# Patient Record
Sex: Male | Born: 1960 | Race: White | Hispanic: No | Marital: Married | Smoking: Former smoker
Health system: Southern US, Community
[De-identification: ages and names within clinical notes are randomized; demographics above are authoritative.]

## PROBLEM LIST (undated history)

## (undated) DIAGNOSIS — E785 Hyperlipidemia, unspecified: Secondary | ICD-10-CM

## (undated) HISTORY — PX: KNEE ARTHROSCOPY: SUR90

## (undated) HISTORY — DX: Hyperlipidemia, unspecified: E78.5

## (undated) HISTORY — PX: BRAIN SURGERY: SHX531

---

## 2004-02-09 ENCOUNTER — Ambulatory Visit (HOSPITAL_COMMUNITY): Admission: RE | Admit: 2004-02-09 | Discharge: 2004-02-09 | Payer: Self-pay | Admitting: Specialist

## 2006-12-11 ENCOUNTER — Ambulatory Visit: Payer: Self-pay | Admitting: Internal Medicine

## 2006-12-11 ENCOUNTER — Encounter: Admission: RE | Admit: 2006-12-11 | Discharge: 2006-12-11 | Payer: Self-pay | Admitting: Internal Medicine

## 2006-12-16 ENCOUNTER — Encounter: Payer: Self-pay | Admitting: Internal Medicine

## 2006-12-31 ENCOUNTER — Ambulatory Visit: Payer: Self-pay | Admitting: Internal Medicine

## 2006-12-31 LAB — CONVERTED CEMR LAB
Cholesterol, target level: 200 mg/dL
HDL goal, serum: 40 mg/dL

## 2007-03-23 ENCOUNTER — Ambulatory Visit: Payer: Self-pay | Admitting: Internal Medicine

## 2007-03-23 DIAGNOSIS — E785 Hyperlipidemia, unspecified: Secondary | ICD-10-CM | POA: Insufficient documentation

## 2007-07-24 ENCOUNTER — Telehealth (INDEPENDENT_AMBULATORY_CARE_PROVIDER_SITE_OTHER): Payer: Self-pay | Admitting: *Deleted

## 2007-10-05 ENCOUNTER — Ambulatory Visit: Payer: Self-pay | Admitting: Internal Medicine

## 2007-10-05 DIAGNOSIS — F988 Other specified behavioral and emotional disorders with onset usually occurring in childhood and adolescence: Secondary | ICD-10-CM | POA: Insufficient documentation

## 2008-06-09 IMAGING — CR DG CHEST 2V
2 series · 2 of 2 positions shown · non-contrast
Comparison: none

CLINICAL DATA: Short of breath with exertion.  Smoking history. 
 CHEST - 2 VIEW:

[w chest pa]
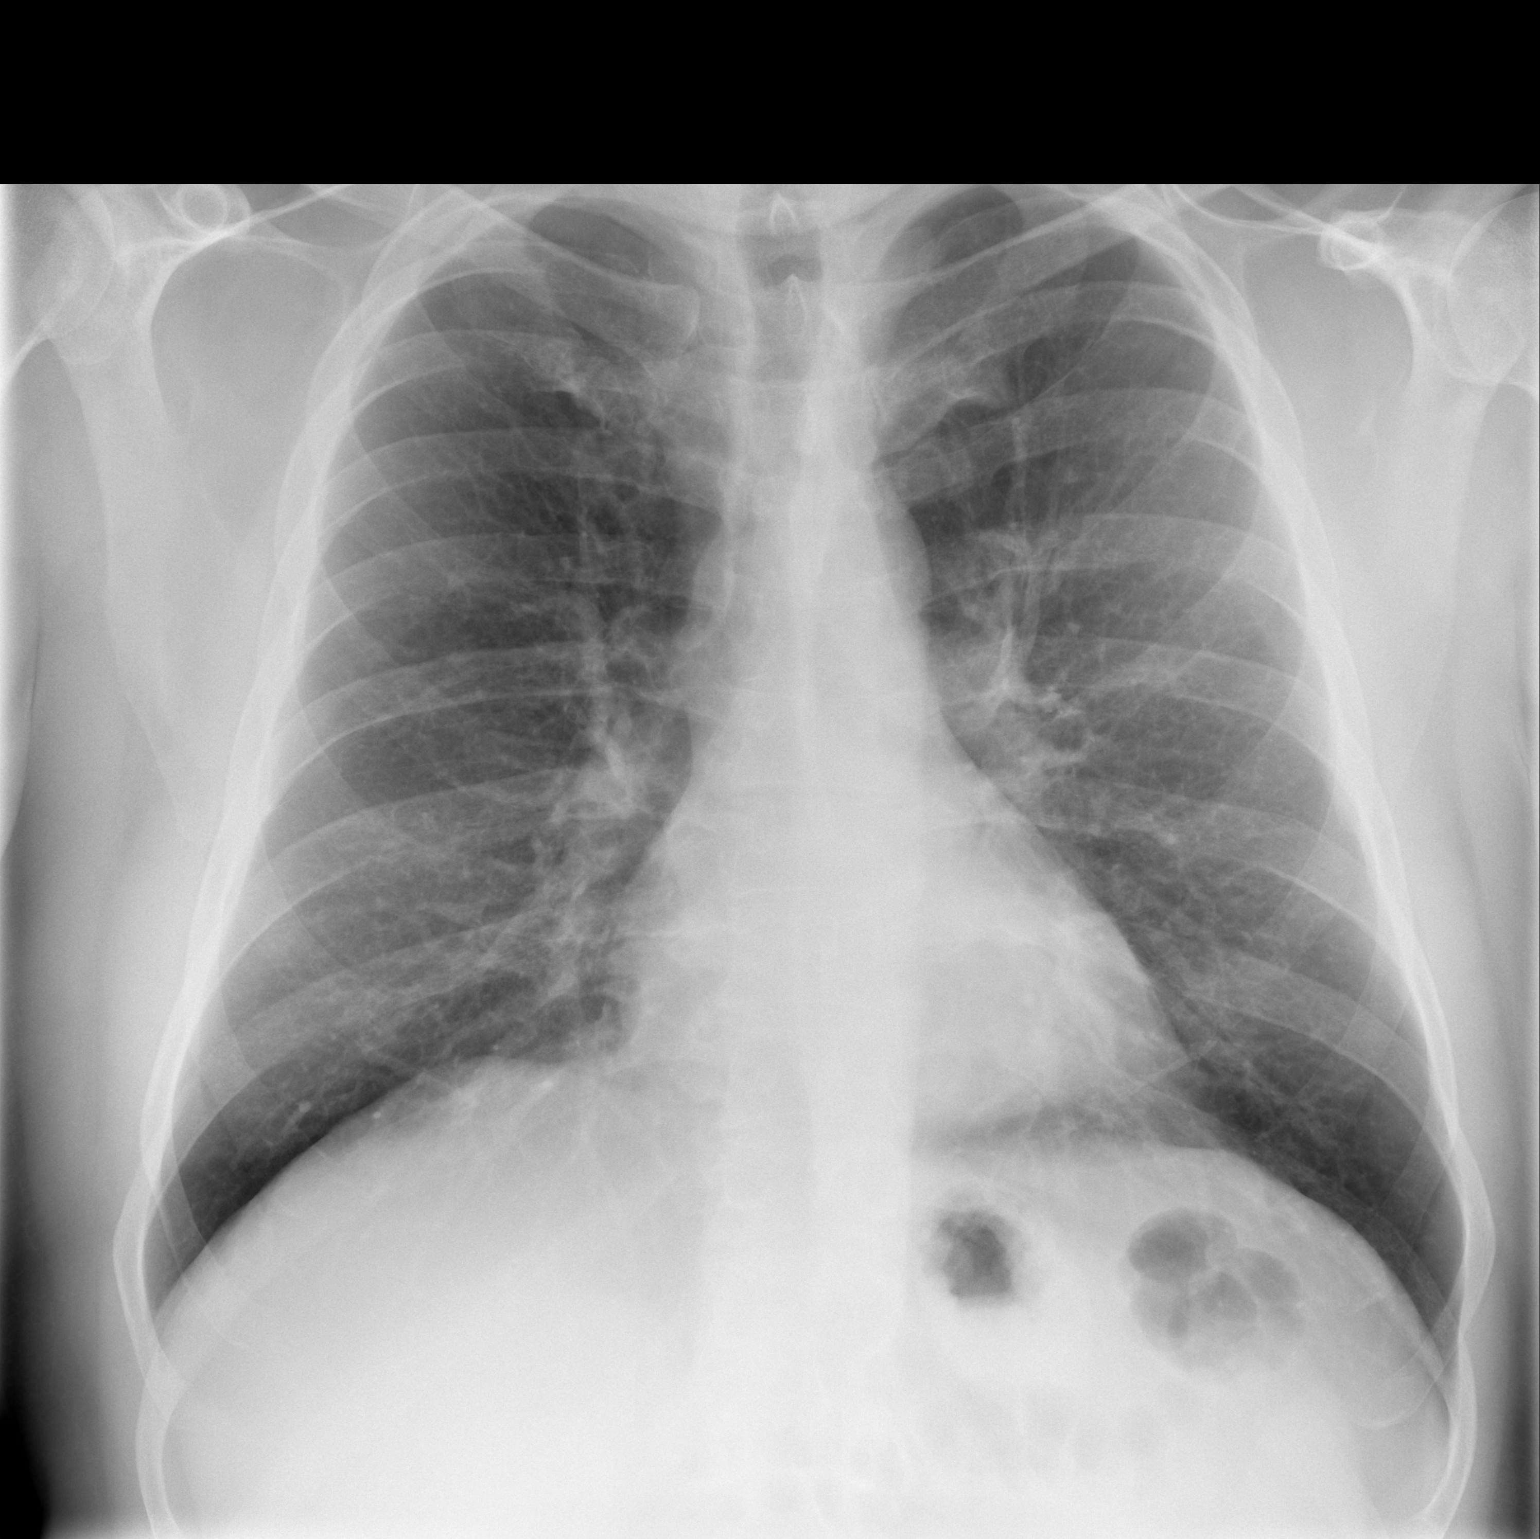

[w chest lat]
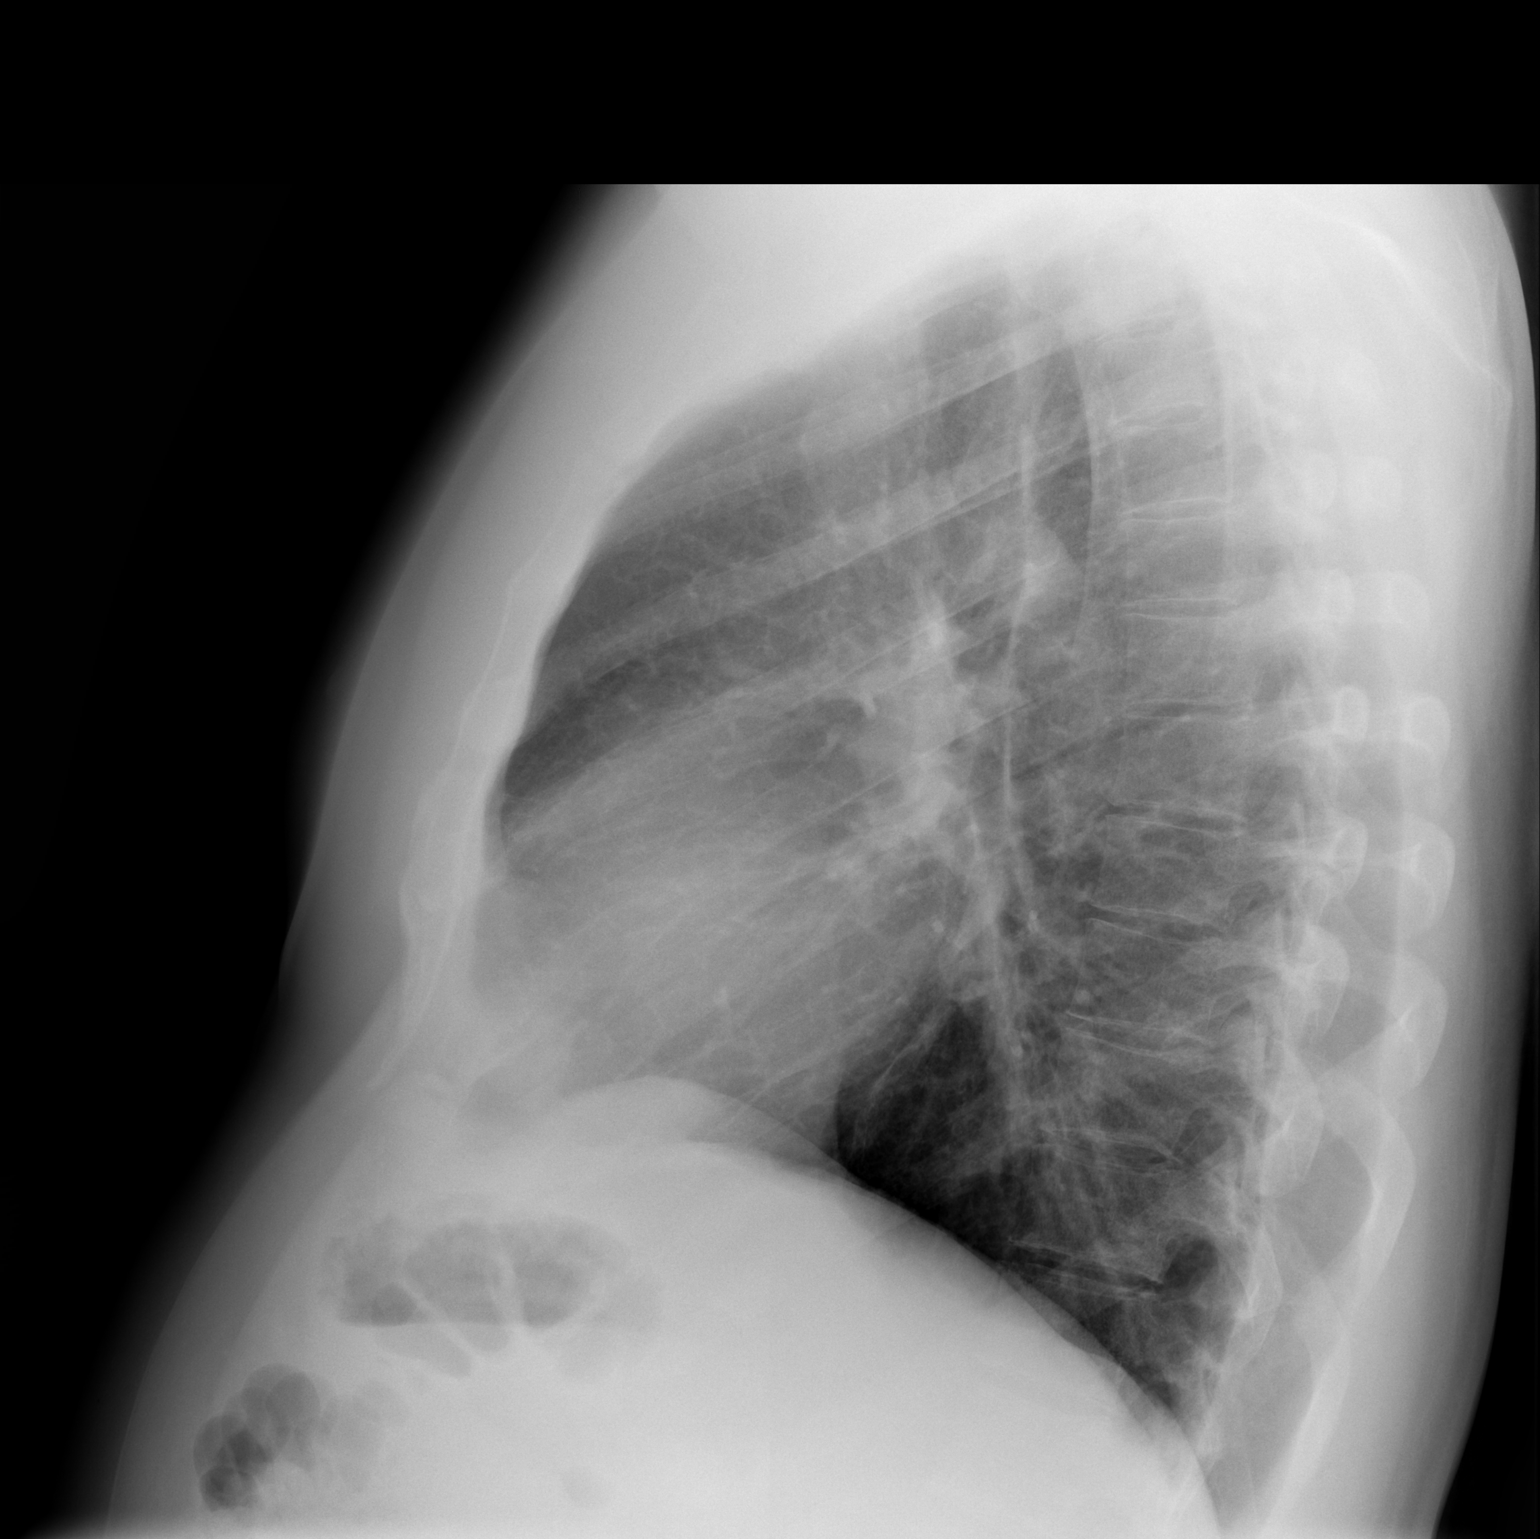

[2 of 2 positions shown; findings below may reference images not displayed]

FINDINGS: Two views of the chest show the lungs to be clear, and slightly hyperaerated.  The heart is within normal limits in size.  Old, healed right clavicular fracture is present.
IMPRESSION: No active lung disease.

## 2008-06-24 ENCOUNTER — Ambulatory Visit: Payer: Self-pay | Admitting: Internal Medicine

## 2008-06-24 DIAGNOSIS — R635 Abnormal weight gain: Secondary | ICD-10-CM | POA: Insufficient documentation

## 2008-06-25 ENCOUNTER — Encounter: Payer: Self-pay | Admitting: Internal Medicine

## 2008-07-18 ENCOUNTER — Ambulatory Visit: Payer: Self-pay | Admitting: Internal Medicine

## 2008-07-20 ENCOUNTER — Encounter (INDEPENDENT_AMBULATORY_CARE_PROVIDER_SITE_OTHER): Payer: Self-pay | Admitting: *Deleted

## 2008-11-01 ENCOUNTER — Ambulatory Visit: Payer: Self-pay | Admitting: Internal Medicine

## 2008-11-05 LAB — CONVERTED CEMR LAB
ALT: 44 units/L (ref 0–53)
AST: 25 units/L (ref 0–37)
Albumin: 3.9 g/dL (ref 3.5–5.2)
Bilirubin, Direct: 0.1 mg/dL (ref 0.0–0.3)
Cholesterol: 253 mg/dL — ABNORMAL HIGH (ref 0–200)
Direct LDL: 182.3 mg/dL
HDL: 36.2 mg/dL — ABNORMAL LOW (ref >39.00)
Total Bilirubin: 1.1 mg/dL (ref 0.3–1.2)
Total CHOL/HDL Ratio: 7
Triglycerides: 156 mg/dL — ABNORMAL HIGH (ref 0.0–149.0)
VLDL: 31.2 mg/dL (ref 0.0–40.0)

## 2008-11-08 ENCOUNTER — Encounter (INDEPENDENT_AMBULATORY_CARE_PROVIDER_SITE_OTHER): Payer: Self-pay | Admitting: *Deleted

## 2009-09-26 ENCOUNTER — Ambulatory Visit: Payer: Self-pay | Admitting: Internal Medicine

## 2009-09-26 DIAGNOSIS — R259 Unspecified abnormal involuntary movements: Secondary | ICD-10-CM | POA: Insufficient documentation

## 2009-10-02 ENCOUNTER — Ambulatory Visit: Payer: Self-pay | Admitting: Internal Medicine

## 2009-10-04 LAB — CONVERTED CEMR LAB
Alkaline Phosphatase: 54 units/L (ref 39–117)
BUN: 11 mg/dL (ref 6–23)
Basophils Relative: 0.1 % (ref 0.0–3.0)
Chloride: 101 meq/L (ref 96–112)
Cholesterol: 262 mg/dL — ABNORMAL HIGH (ref 0–200)
Eosinophils Absolute: 0.2 10*3/uL (ref 0.0–0.7)
HDL: 41.6 mg/dL (ref >39.00)
Hemoglobin: 14.5 g/dL (ref 13.0–17.0)
Lymphocytes Relative: 38.1 % (ref 12.0–46.0)
MCHC: 32.8 g/dL (ref 30.0–36.0)
MCV: 90.6 fL (ref 78.0–100.0)
Magnesium: 2.1 mg/dL (ref 1.5–2.5)
Monocytes Relative: 9.2 % (ref 3.0–12.0)
Neutrophils Relative %: 49.9 % (ref 43.0–77.0)
Platelets: 362 10*3/uL (ref 150.0–400.0)
RBC: 4.87 M/uL (ref 4.22–5.81)
TSH: 1.43 microintl units/mL (ref 0.35–5.50)
Total CHOL/HDL Ratio: 6
VLDL: 47 mg/dL — ABNORMAL HIGH (ref 0.0–40.0)
WBC: 5.9 10*3/uL (ref 4.5–10.5)

## 2010-08-05 LAB — CONVERTED CEMR LAB
ALT: 29 units/L (ref 0–40)
ALT: 52 U/L
AST: 26 U/L
Albumin: 4.1 g/dL
Albumin: 4.2 g/dL (ref 3.5–5.2)
Alkaline Phosphatase: 58 U/L
Alkaline Phosphatase: 68 units/L (ref 39–117)
BUN: 13 mg/dL
Basophils Absolute: 0.1 10*3/uL (ref 0.0–0.1)
Basophils Absolute: 0.1 K/uL
Basophils Relative: 0.9 %
Bilirubin, Direct: 0.1 mg/dL
CO2: 27 meq/L
Calcium: 9.4 mg/dL
Calcium: 9.8 mg/dL (ref 8.4–10.5)
Chloride: 102 meq/L
Chloride: 105 meq/L (ref 96–112)
Creatinine, Ser: 0.8 mg/dL
Creatinine, Ser: 0.9 mg/dL (ref 0.4–1.5)
Eosinophils Absolute: 0.2 K/uL
Eosinophils Relative: 3.5 %
Free T4: 1 ng/dL
GFR calc Af Amer: 134 mL/min
GFR calc non Af Amer: 111 mL/min
Glucose, Bld: 112 mg/dL — ABNORMAL HIGH
HCT: 43.5 %
HCT: 46.1 % (ref 39.0–52.0)
Hemoglobin: 15.3 g/dL
Hemoglobin: 16.1 g/dL (ref 13.0–17.0)
Hgb A1c MFr Bld: 5.8 %
Lymphocytes Relative: 34.9 %
Lymphocytes Relative: 37.3 % (ref 12.0–46.0)
MCHC: 35 g/dL (ref 30.0–36.0)
MCHC: 35.3 g/dL
MCV: 89.3 fL
Monocytes Absolute: 0.5 10*3/uL (ref 0.2–0.7)
Monocytes Absolute: 0.5 K/uL
Monocytes Relative: 6.9 % (ref 3.0–11.0)
Monocytes Relative: 8.1 %
Neutro Abs: 2.9 K/uL
Neutrophils Relative %: 52.6 %
PSA: 0.48 ng/mL
PSA: 1.48 ng/mL (ref 0.10–4.00)
Platelets: 306 K/uL
Platelets: 340 10*3/uL (ref 150–400)
Potassium: 4.1 meq/L
Potassium: 4.4 meq/L (ref 3.5–5.1)
RBC: 4.87 M/uL
RDW: 12.3 %
Sodium: 136 meq/L
TSH: 0.86 microintl units/mL (ref 0.35–5.50)
TSH: 1.09 u[IU]/mL
Total Bilirubin: 1.1 mg/dL (ref 0.3–1.2)
Total Bilirubin: 1.3 mg/dL — ABNORMAL HIGH
Total Protein: 6.7 g/dL (ref 6.0–8.3)
Total Protein: 7.1 g/dL
WBC: 5.7 10*3/microliter

## 2010-08-07 NOTE — Assessment & Plan Note (Signed)
Summary: FOR RIGHT EYE TWITCHES///PH   Vital Signs:  Patient profile:   50 year old male Weight:      278 pounds Temp:     98.2 degrees F oral Pulse rate:   80 / minute BP sitting:   118 / 72  (left arm)  Vitals Entered By: Jeremy Johann CMA (September 26, 2009 4:32 PM) CC: left eye twitching x1year Comments REVIEWED MED LIST, PATIENT AGREED DOSE AND INSTRUCTION CORRECT    CC:  left eye twitching x1year.  History of Present Illness: Twitching of OS & L face > 10 months; he has 1 pot of coffee / day & 22 oz Diet Pepsi / day. Non smoker X 2+ years. No FH of tremors. No PMH of Bell's Palsy. Note : off statin 6+ months.  Allergies (verified): No Known Drug Allergies  Review of Systems General:  Denies sleep disorder. Eyes:  Denies blurring, double vision, and vision loss-both eyes. ENT:  Denies decreased hearing and ringing in ears. Neuro:  Denies brief paralysis, disturbances in coordination, numbness, poor balance, tingling, tremors, and weakness.  Physical Exam  General:  well-nourished; alert,appropriate and cooperative throughout examination Eyes:  No corneal or conjunctival inflammation noted. EOMI. Perrla. Field of  Vision grossly normal. Slight nystagmus , unsustained Neurologic:  alert & oriented X3, cranial nerves II-XII intact, strength normal in all extremities, sensation intact to light touch, gait normal, DTRs symmetrical and normal, finger-to-nose normal, and Romberg negative.   Skin:  Intact without suspicious lesions or rashes   Impression & Recommendations:  Problem # 1:  ABNORMAL INVOLUNTARY MOVEMENTS (ICD-781.0) Facial tic incontext of excess caffeine  Complete Medication List: 1)  Simvastatin 40 Mg Tabs (Simvastatin) .Marland Kitchen.. 1 at bedtime  Patient Instructions: 1)  Wean caffeine over 8 weeks to 2 cups / day.Schedule  fasting labs if no better: 2)  BMP ; 3)  Hepatic Panel; 4)  Lipid Panel; 5)  TSH, free T4;Mg++; 6)  CBC w/ Diff .

## 2011-02-19 ENCOUNTER — Encounter: Payer: Self-pay | Admitting: Internal Medicine

## 2011-02-19 ENCOUNTER — Ambulatory Visit (INDEPENDENT_AMBULATORY_CARE_PROVIDER_SITE_OTHER): Payer: BC Managed Care – PPO | Admitting: Internal Medicine

## 2011-02-19 VITALS — BP 116/78 | HR 65 | Temp 98.6°F | Wt 290.2 lb

## 2011-02-19 DIAGNOSIS — D492 Neoplasm of unspecified behavior of bone, soft tissue, and skin: Secondary | ICD-10-CM

## 2011-02-19 NOTE — Patient Instructions (Signed)
Do not self treat these lesions as there is increased risk of infection because of the location.

## 2011-02-19 NOTE — Progress Notes (Signed)
  Subjective:    Patient ID: Nolon Nations, male    DOB: Feb 23, 1961, 50 y.o.   MRN: [id number]  HPI Skin lesions: Location: perineal area  Onset: 8 months or >   Course: increasing in number, not size Self-treated with: no          History Pruritis: no  Tenderness: no, only one which is tender with pressure  Red Flags Feeling ill: no Fever: no      Review of Systems     Objective:   Physical Exam  He is healthy appearing, in no distress.  Has no organomegaly or masses in the abdomen.  He has no inguinal lymphadenopathy. He has benign appearing, non-warty polyps on either thigh.  In the intergluteal  area of the buttocks he has multiple skin tags; all have a benign appearance.        Assessment & Plan:  #1 benign papules and skin tags; condyloma acuminatum is not suggested clinically.  Plan: Dermatologic referral for excision/ablation.

## 2011-02-20 ENCOUNTER — Encounter: Payer: Self-pay | Admitting: Internal Medicine

## 2011-03-08 ENCOUNTER — Telehealth: Payer: Self-pay | Admitting: Internal Medicine

## 2011-03-08 NOTE — Telephone Encounter (Signed)
Tried to call Pt to confirm what he need, phone hung up twice will try again later.

## 2011-03-13 NOTE — Telephone Encounter (Signed)
Pt is requesting to be referred to male dermatology. Pt notes that the current derm male physician is out on leave.. Please refer Pt to another derm that is within network.

## 2011-03-13 NOTE — Telephone Encounter (Signed)
Left message to call office

## 2011-03-20 NOTE — Telephone Encounter (Signed)
Referring patient to Dr. Terri Piedra, will inform patient.

## 2011-12-16 ENCOUNTER — Telehealth: Payer: Self-pay | Admitting: Internal Medicine

## 2011-12-16 ENCOUNTER — Other Ambulatory Visit: Payer: Self-pay | Admitting: Internal Medicine

## 2011-12-16 DIAGNOSIS — R253 Fasciculation: Secondary | ICD-10-CM

## 2011-12-16 NOTE — Telephone Encounter (Signed)
patientcalled requesting referral to [facility] ph# [phone] Patient stated he has spoke to Dr. Alwyn Ren about this for over the past 2-yrs.  Patient stated he is still having trouble with his right eye twitching Patient call back# [phone]

## 2011-12-18 NOTE — Telephone Encounter (Signed)
patient called again today to see if this was approved, please call at [phone]

## 2011-12-18 NOTE — Telephone Encounter (Signed)
Patient informed of Referral process [placed 06.10.13]/SLS

## 2014-06-15 ENCOUNTER — Ambulatory Visit (INDEPENDENT_AMBULATORY_CARE_PROVIDER_SITE_OTHER): Payer: BC Managed Care – PPO | Admitting: Internal Medicine

## 2014-06-15 ENCOUNTER — Encounter: Payer: Self-pay | Admitting: Internal Medicine

## 2014-06-15 VITALS — BP 140/88 | HR 67 | Temp 98.3°F | Wt 309.0 lb

## 2014-06-15 DIAGNOSIS — R030 Elevated blood-pressure reading, without diagnosis of hypertension: Secondary | ICD-10-CM

## 2014-06-15 NOTE — Progress Notes (Signed)
   Subjective:    Patient ID: Jose Bennett, male    DOB: [birth year], 53 y.o.   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 He will be hospitalized for 3 days and in the intensive care unit postoperatively. This is to be performed by a neurosurgeon at [facility]  6 months ago his blood pressure was elevated when he was being treated for bladder infection  He does ingest excess salt. He is not on a heart healthy diet  He has had dramatic weight gain.  Maternal grandmother had stroke in her 65s.    Review of Systems    Chest pain, palpitations, tachycardia, exertional dyspnea, paroxysmal nocturnal dyspnea, claudication or edema are absent.  He does describe easy fatigue.       Objective:   Physical Exam   Positive or pertinent findings include:   As per CDC Guidelines ,Epic documents obesity as being present . Abdomen is protuberant with a ventral hernia.  Appears healthy and well-nourished & in no acute distress No carotid bruits are present.No neck vein distention present at 10 - 15 degrees. Thyroid normal to palpation Heart rhythm and rate are normal with no gallop or murmur Chest is clear with no increased work of breathing There is no evidence of aortic aneurysm or renal artery bruits Abdomen soft with no organomegaly . No HJR No clubbing, cyanosis or edema present. Pedal pulses are intact  No ischemic skin changes are present . Fingernails/ toenails healthy  Alert and oriented. Strength, tone, DTRs reflexes normal          Assessment & Plan:  #1 elevated blood pressure without diagnosis of hypertension  #2 hemifacial spasm; preop labs are to be done this week.  #3 easy fatigability  Plan:BMET and TSH recommended as part of the preop panel  Blood pressure goals discussed

## 2014-06-15 NOTE — Progress Notes (Signed)
Pre visit review using our clinic review tool, if applicable. No additional management support is needed unless otherwise documented below in the visit note. 

## 2014-06-15 NOTE — Patient Instructions (Addendum)
Your ideal BP goal = AVERAGE < 135/85.Minimal goal is average < 140/90.  This AVERAGE should be calculated from @ least 5-7 BP readings taken @ different times of day on different days of week. You should not respond to isolated BP readings , but rather the AVERAGE for that week .Please bring your  blood pressure cuff to office visits to verify that it is reliable.It  can also be checked against the blood pressure device at the pharmacy. Finger or wrist cuffs are not dependable; an arm cuff is.  Avoid ingestion of  excess salt/sodium.Cook with pepper & other spices . Use the salt substitute "No Salt" OR the Mrs Deliah Boston products to season food @ the table. Avoid foods which taste salty or "vinegary" as their sodium content will be high.

## 2017-06-17 ENCOUNTER — Ambulatory Visit: Payer: Self-pay | Admitting: Emergency Medicine

## 2017-06-20 ENCOUNTER — Encounter: Payer: Self-pay | Admitting: Emergency Medicine

## 2017-06-20 ENCOUNTER — Other Ambulatory Visit: Payer: Self-pay

## 2017-06-20 ENCOUNTER — Ambulatory Visit (INDEPENDENT_AMBULATORY_CARE_PROVIDER_SITE_OTHER): Payer: BLUE CROSS/BLUE SHIELD | Admitting: Emergency Medicine

## 2017-06-20 VITALS — BP 138/88 | HR 64 | Temp 97.8°F | Resp 16 | Ht 74.0 in | Wt 317.4 lb

## 2017-06-20 DIAGNOSIS — Z0000 Encounter for general adult medical examination without abnormal findings: Secondary | ICD-10-CM | POA: Diagnosis not present

## 2017-06-20 NOTE — Progress Notes (Signed)
Jose Bennett 56 y.o.   Chief Complaint  Patient presents with  . Establish Care  . Annual Exam    HISTORY OF PRESENT ILLNESS: This is a 56 y.o. male Here for annual exam; no complaints and no medical concerns. Grades his health a B+. Had colonoscopy 2011; told it was normal.  HPI   Prior to Admission medications   Medication Sig Start Date End Date Taking? Authorizing Provider  aspirin EC 81 MG tablet Take 81 mg by mouth daily.   Yes [provider]  CALCIUM PO Take by mouth daily.   Yes [provider]  Cholecalciferol (VITAMIN D PO) Take by mouth once a week.   Yes [provider]  Magnesium 250 MG TABS Take by mouth daily.   Yes [provider]    No Known Allergies  Patient Active Problem List   Diagnosis Date Noted  . ABNORMAL INVOLUNTARY MOVEMENTS 09/26/2009  . WEIGHT GAIN 06/24/2008  . ADD 10/05/2007  . HYPERLIPIDEMIA NEC/NOS 03/23/2007    Past Medical History:  Diagnosis Date  . Hyperlipidemia       Social History   Socioeconomic History  . Marital status: Married    Spouse name: Not on file  . Number of children: Not on file  . Years of education: Not on file  . Highest education level: Not on file  Social Needs  . Financial resource strain: Not on file  . Food insecurity - worry: Not on file  . Food insecurity - inability: Not on file  . Transportation needs - medical: Not on file  . Transportation needs - non-medical: Not on file  Occupational History  . Not on file  Tobacco Use  . Smoking status: Former Smoker    Last attempt to quit: 07/23/2007    Years since quitting: 9.9  . Smokeless tobacco: Never Used  Substance and Sexual Activity  . Alcohol use: Yes  . Drug use: No  . Sexual activity: Not on file  Other Topics Concern  . Not on file  Social History Narrative  . Not on file    Family History  Problem Relation Age of Onset  . Prostate cancer Father   . Heart attack Father 37   smoker  . Cancer Father        LUNG  . Diabetes Mother   . Thyroid disease Sister   . Heart attack Maternal Grandfather 70  . Heart attack Paternal Grandfather 56     Review of Systems  Constitutional: Negative.  Negative for chills and fever.  HENT: Negative.  Negative for congestion, ear pain, hearing loss, nosebleeds and sore throat.   Eyes: Negative.  Negative for blurred vision, double vision, photophobia and pain.  Respiratory: Negative.  Negative for cough, hemoptysis and shortness of breath.   Cardiovascular: Negative.  Negative for chest pain, palpitations, claudication and leg swelling.  Gastrointestinal: Positive for blood in stool (twice in the past several months). Negative for abdominal pain, diarrhea, nausea and vomiting.  Genitourinary: Negative.  Negative for dysuria, flank pain, frequency and hematuria.  Musculoskeletal: Negative.  Negative for back pain, joint pain, myalgias and neck pain.  Skin: Negative.  Negative for rash.  Neurological: Negative.  Negative for dizziness, sensory change, focal weakness, weakness and headaches.  Endo/Heme/Allergies: Negative.   All other systems reviewed and are negative.  Vitals:   06/20/17 1036  BP: 138/88  Pulse: 64  Resp: 16  Temp: 97.8 F (36.6 C)  SpO2: 96%  Physical Exam  Constitutional: He is oriented to person, place, and time. He appears well-developed.  Obese.  HENT:  Head: Normocephalic and atraumatic.  Right Ear: External ear normal.  Left Ear: External ear normal.  Nose: Nose normal.  Mouth/Throat: Oropharynx is clear and moist.  Eyes: Conjunctivae and EOM are normal. Pupils are equal, round, and reactive to light.  Neck: Normal range of motion. Neck supple. No JVD present. No thyromegaly present.  Cardiovascular: Normal rate, regular rhythm, normal heart sounds and intact distal pulses.  Pulmonary/Chest: Effort normal and breath sounds normal. No respiratory distress.  Abdominal: Soft. Bowel  sounds are normal. He exhibits no distension and no mass. There is no tenderness. There is no guarding. No hernia.  Genitourinary: Prostate normal. Rectal exam shows external hemorrhoid.  Musculoskeletal: Normal range of motion.  Lymphadenopathy:    He has no cervical adenopathy.  Neurological: He is alert and oriented to person, place, and time. No sensory deficit. He exhibits normal muscle tone. Coordination normal.  Skin: Skin is warm and dry. Capillary refill takes less than 2 seconds. No rash noted.  Psychiatric: He has a normal mood and affect. His behavior is normal.  Vitals reviewed.    ASSESSMENT & PLAN: Dietrich was seen today for establish care and annual exam.  Diagnoses and all orders for this visit:  Routine general medical examination at a health care facility -     CBC with Differential -     Comprehensive metabolic panel -     Hemoglobin A1c -     Lipid panel -     PSA(Must document that pt has been informed of limitations of PSA testing.) -     TSH -     Hepatitis C antibody screen -     HIV antibody -     Visual acuity screening (in office) -     Urine Microscopic    Patient Instructions       IF you received an x-ray today, you will receive an invoice from [facility]. Please contact [facility] at [phone] with questions or concerns regarding your invoice.   IF you received labwork today, you will receive an invoice from [facility]. Please contact [facility] at [phone] with questions or concerns regarding your invoice.   Our billing staff will not be able to assist you with questions regarding bills from these companies.  You will be contacted with the lab results as soon as they are available. The fastest way to get your results is to activate your My Chart account. Instructions are located on the last page of this paperwork. If you have not heard from [place] regarding the results in 2 weeks, please contact this office.       Health Maintenance, Male A healthy lifestyle and preventive care is important for your health and wellness. Ask your health care provider about what schedule of regular examinations is right for you. What should I know about weight and diet? Eat a Healthy Diet  Eat plenty of vegetables, fruits, whole grains, low-fat dairy products, and lean protein.  Do not eat a lot of foods high in solid fats, added sugars, or salt.  Maintain a Healthy Weight Regular exercise can help you achieve or maintain a healthy weight. You should:  Do at least 150 minutes of exercise each week. The exercise should increase your heart rate and make you sweat (moderate-intensity exercise).  Do strength-training exercises at least twice a week.  Watch Your Levels of Cholesterol and  Blood Lipids  Have your blood tested for lipids and cholesterol every 5 years starting at 55 years of age. If you are at high risk for heart disease, you should start having your blood tested when you are 55 years old. You may need to have your cholesterol levels checked more often if: ? Your lipid or cholesterol levels are high. ? You are older than 55 years of age. ? You are at high risk for heart disease.  What should I know about cancer screening? Many types of cancers can be detected early and may often be prevented. Lung Cancer  You should be screened every year for lung cancer if: ? You are a current smoker who has smoked for at least 30 years. ? You are a former smoker who has quit within the past 15 years.  Talk to your health care provider about your screening options, when you should start screening, and how often you should be screened.  Colorectal Cancer  Routine colorectal cancer screening usually begins at 54 years of age and should be repeated every 5-10 years until you are 55 years old. You may need to be screened more often if early forms of precancerous polyps or small growths are found. Your health care  provider may recommend screening at an earlier age if you have risk factors for colon cancer.  Your health care provider may recommend using home test kits to check for hidden blood in the stool.  A small camera at the end of a tube can be used to examine your colon (sigmoidoscopy or colonoscopy). This checks for the earliest forms of colorectal cancer.  Prostate and Testicular Cancer  Depending on your age and overall health, your health care provider may do certain tests to screen for prostate and testicular cancer.  Talk to your health care provider about any symptoms or concerns you have about testicular or prostate cancer.  Skin Cancer  Check your skin from head to toe regularly.  Tell your health care provider about any new moles or changes in moles, especially if: ? There is a change in a mole's size, shape, or color. ? You have a mole that is larger than a pencil eraser.  Always use sunscreen. Apply sunscreen liberally and repeat throughout the day.  Protect yourself by wearing long sleeves, pants, a wide-brimmed hat, and sunglasses when outside.  What should I know about heart disease, diabetes, and high blood pressure?  If you are 44-20 years of age, have your blood pressure checked every 3-5 years. If you are 52 years of age or older, have your blood pressure checked every year. You should have your blood pressure measured twice-once when you are at a hospital or clinic, and once when you are not at a hospital or clinic. Record the average of the two measurements. To check your blood pressure when you are not at a hospital or clinic, you can use: ? An automated blood pressure machine at a pharmacy. ? A home blood pressure monitor.  Talk to your health care provider about your target blood pressure.  If you are between 7-89 years old, ask your health care provider if you should take aspirin to prevent heart disease.  Have regular diabetes screenings by checking your  fasting blood sugar level. ? If you are at a normal weight and have a low risk for diabetes, have this test once every three years after the age of 62. ? If you are overweight and have a high  risk for diabetes, consider being tested at a younger age or more often.  A one-time screening for abdominal aortic aneurysm (AAA) by ultrasound is recommended for men aged 45-75 years who are current or former smokers. What should I know about preventing infection? Hepatitis B If you have a higher risk for hepatitis B, you should be screened for this virus. Talk with your health care provider to find out if you are at risk for hepatitis B infection. Hepatitis C Blood testing is recommended for:  Everyone born from [birth year] through 1964.  Anyone with known risk factors for hepatitis C.  Sexually Transmitted Diseases (STDs)  You should be screened each year for STDs including gonorrhea and chlamydia if: ? You are sexually active and are younger than 55 years of age. ? You are older than 55 years of age and your health care provider tells you that you are at risk for this type of infection. ? Your sexual activity has changed since you were last screened and you are at an increased risk for chlamydia or gonorrhea. Ask your health care provider if you are at risk.  Talk with your health care provider about whether you are at high risk of being infected with HIV. Your health care provider may recommend a prescription medicine to help prevent HIV infection.  What else can I do?  Schedule regular health, dental, and eye exams.  Stay current with your vaccines (immunizations).  Do not use any tobacco products, such as cigarettes, chewing tobacco, and e-cigarettes. If you need help quitting, ask your health care provider.  Limit alcohol intake to no more than 2 drinks per day. One drink equals 12 ounces of beer, 5 ounces of wine, or 1 ounces of hard liquor.  Do not use street drugs.  Do not share  needles.  Ask your health care provider for help if you need support or information about quitting drugs.  Tell your health care provider if you often feel depressed.  Tell your health care provider if you have ever been abused or do not feel safe at home. This information is not intended to replace advice given to you by your health care provider. Make sure you discuss any questions you have with your health care provider. Document Released: 12/21/2007 Document Revised: 02/21/2016 Document Reviewed: 03/28/2015 Elsevier Interactive Patient Education  2018 [place] (AHA) Exercise Recommendation  Being physically active is important to prevent heart disease and stroke, the nation's No. 1and No. 5killers. To improve overall cardiovascular health, we suggest at least 150 minutes per week of moderate exercise or 75 minutes per week of vigorous exercise (or a combination of moderate and vigorous activity). Thirty minutes a day, five times a week is an easy goal to remember. You will also experience benefits even if you divide your time into two or three segments of 10 to 15 minutes per day.  For people who would benefit from lowering their blood pressure or cholesterol, we recommend 40 minutes of aerobic exercise of moderate to vigorous intensity three to four times a week to lower the risk for heart attack and stroke.  Physical activity is anything that makes you move your body and burn calories.  This includes things like climbing stairs or playing sports. Aerobic exercises benefit your heart, and include walking, jogging, swimming or biking. Strength and stretching exercises are best for overall stamina and flexibility.  The simplest, positive change you can make to effectively improve your heart health  is to start walking. It's enjoyable, free, easy, social and great exercise. A walking program is flexible and boasts high success rates because people can stick  with it. It's easy for walking to become a regular and satisfying part of life.   For Overall Cardiovascular Health:  At least 30 minutes of moderate-intensity aerobic activity at least 5 days per week for a total of 150  OR   At least 25 minutes of vigorous aerobic activity at least 3 days per week for a total of 75 minutes; or a combination of moderate- and vigorous-intensity aerobic activity  AND   Moderate- to high-intensity muscle-strengthening activity at least 2 days per week for additional health benefits.  For Lowering Blood Pressure and Cholesterol  An average 40 minutes of moderate- to vigorous-intensity aerobic activity 3 or 4 times per week  What if I can't make it to the time goal? Something is always better than nothing! And everyone has to start somewhere. Even if you've been sedentary for years, today is the day you can begin to make healthy changes in your life. If you don't think you'll make it for 30 or 40 minutes, set a reachable goal for today. You can work up toward your overall goal by increasing your time as you get stronger. Don't let all-or-nothing thinking rob you of doing what you can every day.  Source:[email or web address], MD [facility]

## 2017-06-20 NOTE — Patient Instructions (Addendum)
   IF you received an x-ray today, you will receive an invoice from [facility]. Please contact  Radiology at [phone] with questions or concerns regarding your invoice.   IF you received labwork today, you will receive an invoice from [facility]. Please contact [facility] at [phone] with questions or concerns regarding your invoice.   Our billing staff will not be able to assist you with questions regarding bills from these companies.  You will be contacted with the lab results as soon as they are available. The fastest way to get your results is to activate your My Chart account. Instructions are located on the last page of this paperwork. If you have not heard from us regarding the results in 2 weeks, please contact this office.      Health Maintenance, Male A healthy lifestyle and preventive care is important for your health and wellness. Ask your health care provider about what schedule of regular examinations is right for you. What should I know about weight and diet? Eat a Healthy Diet  Eat plenty of vegetables, fruits, whole grains, low-fat dairy products, and lean protein.  Do not eat a lot of foods high in solid fats, added sugars, or salt.  Maintain a Healthy Weight Regular exercise can help you achieve or maintain a healthy weight. You should:  Do at least 150 minutes of exercise each week. The exercise should increase your heart rate and make you sweat (moderate-intensity exercise).  Do strength-training exercises at least twice a week.  Watch Your Levels of Cholesterol and Blood Lipids  Have your blood tested for lipids and cholesterol every 5 years starting at 55 years of age. If you are at high risk for heart disease, you should start having your blood tested when you are 55 years old. You may need to have your cholesterol levels checked more often if: ? Your lipid or cholesterol levels are high. ? You are older than 55 years of age. ? You  are at high risk for heart disease.  What should I know about cancer screening? Many types of cancers can be detected early and may often be prevented. Lung Cancer  You should be screened every year for lung cancer if: ? You are a current smoker who has smoked for at least 30 years. ? You are a former smoker who has quit within the past 15 years.  Talk to your health care provider about your screening options, when you should start screening, and how often you should be screened.  Colorectal Cancer  Routine colorectal cancer screening usually begins at 55 years of age and should be repeated every 5-10 years until you are 55 years old. You may need to be screened more often if early forms of precancerous polyps or small growths are found. Your health care provider may recommend screening at an earlier age if you have risk factors for colon cancer.  Your health care provider may recommend using home test kits to check for hidden blood in the stool.  A small camera at the end of a tube can be used to examine your colon (sigmoidoscopy or colonoscopy). This checks for the earliest forms of colorectal cancer.  Prostate and Testicular Cancer  Depending on your age and overall health, your health care provider may do certain tests to screen for prostate and testicular cancer.  Talk to your health care provider about any symptoms or concerns you have about testicular or prostate cancer.  Skin Cancer  Check your skin   from head to toe regularly.  Tell your health care provider about any new moles or changes in moles, especially if: ? There is a change in a mole's size, shape, or color. ? You have a mole that is larger than a pencil eraser.  Always use sunscreen. Apply sunscreen liberally and repeat throughout the day.  Protect yourself by wearing long sleeves, pants, a wide-brimmed hat, and sunglasses when outside.  What should I know about heart disease, diabetes, and high blood  pressure?  If you are 18-39 years of age, have your blood pressure checked every 3-5 years. If you are 40 years of age or older, have your blood pressure checked every year. You should have your blood pressure measured twice-once when you are at a hospital or clinic, and once when you are not at a hospital or clinic. Record the average of the two measurements. To check your blood pressure when you are not at a hospital or clinic, you can use: ? An automated blood pressure machine at a pharmacy. ? A home blood pressure monitor.  Talk to your health care provider about your target blood pressure.  If you are between 45-79 years old, ask your health care provider if you should take aspirin to prevent heart disease.  Have regular diabetes screenings by checking your fasting blood sugar level. ? If you are at a normal weight and have a low risk for diabetes, have this test once every three years after the age of 45. ? If you are overweight and have a high risk for diabetes, consider being tested at a younger age or more often.  A one-time screening for abdominal aortic aneurysm (AAA) by ultrasound is recommended for men aged 65-75 years who are current or former smokers. What should I know about preventing infection? Hepatitis B If you have a higher risk for hepatitis B, you should be screened for this virus. Talk with your health care provider to find out if you are at risk for hepatitis B infection. Hepatitis C Blood testing is recommended for:  Everyone born from 1945 through 1965.  Anyone with known risk factors for hepatitis C.  Sexually Transmitted Diseases (STDs)  You should be screened each year for STDs including gonorrhea and chlamydia if: ? You are sexually active and are younger than 55 years of age. ? You are older than 55 years of age and your health care provider tells you that you are at risk for this type of infection. ? Your sexual activity has changed since you were last  screened and you are at an increased risk for chlamydia or gonorrhea. Ask your health care provider if you are at risk.  Talk with your health care provider about whether you are at high risk of being infected with HIV. Your health care provider may recommend a prescription medicine to help prevent HIV infection.  What else can I do?  Schedule regular health, dental, and eye exams.  Stay current with your vaccines (immunizations).  Do not use any tobacco products, such as cigarettes, chewing tobacco, and e-cigarettes. If you need help quitting, ask your health care provider.  Limit alcohol intake to no more than 2 drinks per day. One drink equals 12 ounces of beer, 5 ounces of wine, or 1 ounces of hard liquor.  Do not use street drugs.  Do not share needles.  Ask your health care provider for help if you need support or information about quitting drugs.  Tell your health care   provider if you often feel depressed.  Tell your health care provider if you have ever been abused or do not feel safe at home. This information is not intended to replace advice given to you by your health care provider. Make sure you discuss any questions you have with your health care provider. Document Released: 12/21/2007 Document Revised: 02/21/2016 Document Reviewed: 03/28/2015 Elsevier Interactive Patient Education  2018 [place] (AHA) Exercise Recommendation  Being physically active is important to prevent heart disease and stroke, the nation's No. 1and No. 5killers. To improve overall cardiovascular health, we suggest at least 150 minutes per week of moderate exercise or 75 minutes per week of vigorous exercise (or a combination of moderate and vigorous activity). Thirty minutes a day, five times a week is an easy goal to remember. You will also experience benefits even if you divide your time into two or three segments of 10 to 15 minutes per day.  For people who would  benefit from lowering their blood pressure or cholesterol, we recommend 40 minutes of aerobic exercise of moderate to vigorous intensity three to four times a week to lower the risk for heart attack and stroke.  Physical activity is anything that makes you move your body and burn calories.  This includes things like climbing stairs or playing sports. Aerobic exercises benefit your heart, and include walking, jogging, swimming or biking. Strength and stretching exercises are best for overall stamina and flexibility.  The simplest, positive change you can make to effectively improve your heart health is to start walking. It's enjoyable, free, easy, social and great exercise. A walking program is flexible and boasts high success rates because people can stick with it. It's easy for walking to become a regular and satisfying part of life.   For Overall Cardiovascular Health:  At least 30 minutes of moderate-intensity aerobic activity at least 5 days per week for a total of 150  OR   At least 25 minutes of vigorous aerobic activity at least 3 days per week for a total of 75 minutes; or a combination of moderate- and vigorous-intensity aerobic activity  AND   Moderate- to high-intensity muscle-strengthening activity at least 2 days per week for additional health benefits.  For Lowering Blood Pressure and Cholesterol  An average 40 minutes of moderate- to vigorous-intensity aerobic activity 3 or 4 times per week  What if I can't make it to the time goal? Something is always better than nothing! And everyone has to start somewhere. Even if you've been sedentary for years, today is the day you can begin to make healthy changes in your life. If you don't think you'll make it for 30 or 40 minutes, set a reachable goal for today. You can work up toward your overall goal by increasing your time as you get stronger. Don't let all-or-nothing thinking rob you of doing what you can every day.   Source:http://www.heart.org

## 2017-06-21 LAB — COMPREHENSIVE METABOLIC PANEL
A/G RATIO: 1.8 (ref 1.2–2.2)
ALK PHOS: 65 IU/L (ref 39–117)
ALT: 51 IU/L — AB (ref 0–44)
AST: 25 IU/L (ref 0–40)
Albumin: 4.7 g/dL (ref 3.5–5.5)
BUN/Creatinine Ratio: 15 (ref 9–20)
BUN: 13 mg/dL (ref 6–24)
Bilirubin Total: 0.6 mg/dL (ref 0.0–1.2)
CALCIUM: 9.7 mg/dL (ref 8.7–10.2)
CO2: 24 mmol/L (ref 20–29)
Chloride: 100 mmol/L (ref 96–106)
Creatinine, Ser: 0.84 mg/dL (ref 0.76–1.27)
GFR calc Af Amer: 114 mL/min/{1.73_m2} (ref >59)
GFR, EST NON AFRICAN AMERICAN: 99 mL/min/{1.73_m2} (ref >59)
Globulin, Total: 2.6 g/dL (ref 1.5–4.5)
Glucose: 87 mg/dL (ref 65–99)
POTASSIUM: 4.6 mmol/L (ref 3.5–5.2)
SODIUM: 139 mmol/L (ref 134–144)
Total Protein: 7.3 g/dL (ref 6.0–8.5)

## 2017-06-21 LAB — CBC WITH DIFFERENTIAL/PLATELET
Basophils Absolute: 0.1 10*3/uL (ref 0.0–0.2)
Basos: 1 %
EOS (ABSOLUTE): 0.2 10*3/uL (ref 0.0–0.4)
Eos: 3 %
Hematocrit: 46.4 % (ref 37.5–51.0)
Hemoglobin: 16 g/dL (ref 13.0–17.7)
IMMATURE GRANULOCYTES: 0 %
Immature Grans (Abs): 0 10*3/uL (ref 0.0–0.1)
LYMPHS ABS: 2.6 10*3/uL (ref 0.7–3.1)
Lymphs: 41 %
MCH: 30.2 pg (ref 26.6–33.0)
MCHC: 34.5 g/dL (ref 31.5–35.7)
MCV: 88 fL (ref 79–97)
MONOS ABS: 0.4 10*3/uL (ref 0.1–0.9)
Monocytes: 6 %
NEUTROS PCT: 49 %
Neutrophils Absolute: 3.1 10*3/uL (ref 1.4–7.0)
PLATELETS: 358 10*3/uL (ref 150–379)
RBC: 5.29 x10E6/uL (ref 4.14–5.80)
RDW: 13.3 % (ref 12.3–15.4)
WBC: 6.5 10*3/uL (ref 3.4–10.8)

## 2017-06-21 LAB — HEMOGLOBIN A1C
Est. average glucose Bld gHb Est-mCnc: 131 mg/dL
Hgb A1c MFr Bld: 6.2 % — ABNORMAL HIGH (ref 4.8–5.6)

## 2017-06-21 LAB — URINALYSIS, MICROSCOPIC ONLY
Bacteria, UA: NONE SEEN
Casts: NONE SEEN /lpf
EPITHELIAL CELLS (NON RENAL): NONE SEEN /HPF (ref 0–10)
RBC, UA: NONE SEEN /hpf (ref 0–?)
WBC, UA: NONE SEEN /hpf (ref 0–?)

## 2017-06-21 LAB — LIPID PANEL
Chol/HDL Ratio: 7.3 ratio — ABNORMAL HIGH (ref 0.0–5.0)
Cholesterol, Total: 290 mg/dL — ABNORMAL HIGH (ref 100–199)
HDL: 40 mg/dL (ref >39)
LDL CALC: 220 mg/dL — AB (ref 0–99)
TRIGLYCERIDES: 151 mg/dL — AB (ref 0–149)
VLDL Cholesterol Cal: 30 mg/dL (ref 5–40)

## 2017-06-21 LAB — TSH: TSH: 1.12 u[IU]/mL (ref 0.450–4.500)

## 2017-06-21 LAB — HEPATITIS C ANTIBODY: Hep C Virus Ab: <0.1 s/co ratio (ref 0.0–0.9)

## 2017-06-21 LAB — HIV ANTIBODY (ROUTINE TESTING W REFLEX): HIV Screen 4th Generation wRfx: NONREACTIVE

## 2017-06-21 LAB — PSA: Prostate Specific Ag, Serum: 0.6 ng/mL (ref 0.0–4.0)

## 2017-06-23 ENCOUNTER — Encounter: Payer: Self-pay | Admitting: Emergency Medicine

## 2017-06-23 ENCOUNTER — Other Ambulatory Visit: Payer: Self-pay | Admitting: Emergency Medicine

## 2017-06-23 DIAGNOSIS — E7800 Pure hypercholesterolemia, unspecified: Secondary | ICD-10-CM

## 2017-06-23 MED ORDER — PRAVASTATIN SODIUM 40 MG PO TABS: 40.0000 mg | ORAL_TABLET | Freq: Every day | ORAL | 3 refills | Status: DC

## 2017-06-23 NOTE — Telephone Encounter (Signed)
Reviewed today and  MyChart message sent.

## 2018-03-22 ENCOUNTER — Encounter: Payer: Self-pay | Admitting: Emergency Medicine

## 2018-03-30 ENCOUNTER — Ambulatory Visit: Payer: BLUE CROSS/BLUE SHIELD | Admitting: Emergency Medicine

## 2018-03-31 ENCOUNTER — Ambulatory Visit: Payer: BLUE CROSS/BLUE SHIELD | Admitting: Emergency Medicine

## 2018-04-17 ENCOUNTER — Encounter: Payer: Self-pay | Admitting: Emergency Medicine

## 2018-08-20 ENCOUNTER — Other Ambulatory Visit: Payer: Self-pay

## 2018-08-20 ENCOUNTER — Ambulatory Visit (INDEPENDENT_AMBULATORY_CARE_PROVIDER_SITE_OTHER): Payer: BLUE CROSS/BLUE SHIELD | Admitting: Emergency Medicine

## 2018-08-20 ENCOUNTER — Encounter: Payer: Self-pay | Admitting: Emergency Medicine

## 2018-08-20 VITALS — BP 138/78 | HR 64 | Temp 98.6°F | Resp 16 | Ht 75.0 in | Wt 298.0 lb

## 2018-08-20 DIAGNOSIS — Z13220 Encounter for screening for lipoid disorders: Secondary | ICD-10-CM | POA: Diagnosis not present

## 2018-08-20 DIAGNOSIS — E785 Hyperlipidemia, unspecified: Secondary | ICD-10-CM

## 2018-08-20 DIAGNOSIS — Z130 Encounter for screening for diseases of the blood and blood-forming organs and certain disorders involving the immune mechanism: Secondary | ICD-10-CM

## 2018-08-20 DIAGNOSIS — Z23 Encounter for immunization: Secondary | ICD-10-CM

## 2018-08-20 DIAGNOSIS — Z0000 Encounter for general adult medical examination without abnormal findings: Secondary | ICD-10-CM

## 2018-08-20 DIAGNOSIS — Z0001 Encounter for general adult medical examination with abnormal findings: Secondary | ICD-10-CM

## 2018-08-20 DIAGNOSIS — Z1329 Encounter for screening for other suspected endocrine disorder: Secondary | ICD-10-CM | POA: Diagnosis not present

## 2018-08-20 DIAGNOSIS — Z125 Encounter for screening for malignant neoplasm of prostate: Secondary | ICD-10-CM

## 2018-08-20 DIAGNOSIS — Z13228 Encounter for screening for other metabolic disorders: Secondary | ICD-10-CM

## 2018-08-20 MED ORDER — ROSUVASTATIN CALCIUM 10 MG PO TABS: 10.0000 mg | ORAL_TABLET | Freq: Every day | ORAL | 3 refills | Status: DC

## 2018-08-20 NOTE — Patient Instructions (Addendum)

## 2018-08-20 NOTE — Progress Notes (Signed)
Jose Bennett 58 y.o.   Chief Complaint  Patient presents with  . Annual Exam    HISTORY OF PRESENT ILLNESS: This is a 58 y.o. male here for annual exam.  No complaints or medical concerns today. Has a history of dyslipidemia. Lab Results  Component Value Date   CHOL 290 (H) 06/20/2017   HDL 40 06/20/2017   LDLCALC 220 (H) 06/20/2017   LDLDIRECT 188.0 10/02/2009   TRIG 151 (H) 06/20/2017   CHOLHDL 7.3 (H) 06/20/2017   Wt Readings from Last 3 Encounters:  08/20/18 298 lb (135.2 kg)  06/20/17 (!) 317 lb 6.4 oz (144 kg)  06/15/14 (!) 309 lb (140.2 kg)     HPI   Prior to Admission medications   Medication Sig Start Date End Date Taking? Authorizing Provider  aspirin EC 81 MG tablet Take 81 mg by mouth daily.   Yes [provider]  CALCIUM PO Take by mouth daily.   Yes [provider]  Cholecalciferol (VITAMIN D PO) Take by mouth once a week.   Yes [provider]  Magnesium 250 MG TABS Take by mouth daily.   Yes [provider]  pravastatin (PRAVACHOL) 40 MG tablet Take 1 tablet (40 mg total) by mouth daily. 06/23/17  Yes SagardiaInes Bloomer, MD    No Known Allergies  Patient Active Problem List   Diagnosis Date Noted  . ABNORMAL INVOLUNTARY MOVEMENTS 09/26/2009  . WEIGHT GAIN 06/24/2008  . ADD 10/05/2007  . HYPERLIPIDEMIA NEC/NOS 03/23/2007    Past Medical History:  Diagnosis Date  . Hyperlipidemia     Past Surgical History:  Procedure Laterality Date  . BRAIN SURGERY    . KNEE ARTHROSCOPY     x2    Social History   Socioeconomic History  . Marital status: Married    Spouse name: Not on file  . Number of children: Not on file  . Years of education: Not on file  . Highest education level: Not on file  Occupational History  . Not on file  Social Needs  . Financial resource strain: Not on file  . Food insecurity:    Worry: Not on file    Inability: Not on file  . Transportation needs:    Medical: Not on  file    Non-medical: Not on file  Tobacco Use  . Smoking status: Former Smoker    Last attempt to quit: 07/23/2007    Years since quitting: 11.0  . Smokeless tobacco: Never Used  Substance and Sexual Activity  . Alcohol use: Yes  . Drug use: No  . Sexual activity: Not on file  Lifestyle  . Physical activity:    Days per week: Not on file    Minutes per session: Not on file  . Stress: Not on file  Relationships  . Social connections:    Talks on phone: Not on file    Gets together: Not on file    Attends religious service: Not on file    Active member of club or organization: Not on file    Attends meetings of clubs or organizations: Not on file    Relationship status: Not on file  . Intimate partner violence:    Fear of current or ex partner: Not on file    Emotionally abused: Not on file    Physically abused: Not on file    Forced sexual activity: Not on file  Other Topics Concern  . Not on file  Social History Narrative  .  Not on file    Family History  Problem Relation Age of Onset  . Prostate cancer Father   . Heart attack Father 39       smoker  . Cancer Father        LUNG  . Diabetes Mother   . Thyroid disease Sister   . Heart attack Maternal Grandfather 70  . Heart attack Paternal Grandfather 59     Review of Systems  Constitutional: Negative.  Negative for chills and fever.  HENT: Negative.  Negative for hearing loss.   Eyes: Negative.  Negative for blurred vision.  Respiratory: Negative.  Negative for cough, hemoptysis, shortness of breath and wheezing.   Cardiovascular: Negative.  Negative for chest pain and palpitations.  Gastrointestinal: Negative for abdominal pain, diarrhea, nausea and vomiting.  Genitourinary: Negative.  Negative for dysuria and hematuria.  Musculoskeletal: Negative.  Negative for myalgias and neck pain.  Skin: Negative.   Neurological: Negative.  Negative for dizziness and headaches.  Endo/Heme/Allergies: Negative.     Psychiatric/Behavioral: Negative.   All other systems reviewed and are negative.  Vitals:   08/20/18 1335  BP: 138/78  Pulse: 64  Resp: 16  Temp: 98.6 F (37 C)  SpO2: 96%     Physical Exam Vitals signs reviewed.  Constitutional:      Appearance: Normal appearance.  HENT:     Head: Normocephalic and atraumatic.     Nose: Nose normal.     Mouth/Throat:     Mouth: Mucous membranes are moist.     Pharynx: Oropharynx is clear.  Eyes:     Extraocular Movements: Extraocular movements intact.     Conjunctiva/sclera: Conjunctivae normal.     Pupils: Pupils are equal, round, and reactive to light.  Neck:     Musculoskeletal: Normal range of motion and neck supple.  Cardiovascular:     Rate and Rhythm: Normal rate and regular rhythm.     Pulses: Normal pulses.     Heart sounds: Normal heart sounds.  Pulmonary:     Effort: Pulmonary effort is normal.     Breath sounds: Normal breath sounds.  Abdominal:     Palpations: Abdomen is soft. There is no mass.     Tenderness: There is no abdominal tenderness. There is no guarding.  Musculoskeletal: Normal range of motion.  Skin:    General: Skin is warm and dry.     Capillary Refill: Capillary refill takes less than 2 seconds.  Neurological:     General: No focal deficit present.     Mental Status: He is alert and oriented to person, place, and time.     Sensory: No sensory deficit.     Motor: No weakness.  Psychiatric:        Mood and Affect: Mood normal.        Behavior: Behavior normal.      ASSESSMENT & PLAN: Jose Bennett was seen today for annual exam.  Diagnoses and all orders for this visit:  Routine general medical examination at a health care facility -     Comprehensive metabolic panel  Screening for deficiency anemia -     CBC with Differential/Platelet  Screening for lipoid disorders -     Lipid panel  Screening for endocrine, metabolic and immunity disorder -     Comprehensive metabolic panel -     Lipid  panel -     TSH  Vaccine for diphtheria-tetanus-pertussis, combined -     Tdap vaccine greater than or equal  to 7yo IM  Prostate cancer screening -     PSA  Dyslipidemia -     rosuvastatin (CRESTOR) 10 MG tablet; Take 1 tablet (10 mg total) by mouth daily.    Patient Instructions       If you have lab work done today you will be contacted with your lab results within the next 2 weeks.  If you have not heard from [place] then please contact us. The fastest way to get your results is to register for My Chart.   IF you received an x-ray today, you will receive an invoice from [facility]. Please contact [facility] at [phone] with questions or concerns regarding your invoice.   IF you received labwork today, you will receive an invoice from [facility]. Please contact [facility] at [phone] with questions or concerns regarding your invoice.   Our billing staff will not be able to assist you with questions regarding bills from these companies.  You will be contacted with the lab results as soon as they are available. The fastest way to get your results is to activate your My Chart account. Instructions are located on the last page of this paperwork. If you have not heard from [place] regarding the results in 2 weeks, please contact this office.       Health Maintenance, Male A healthy lifestyle and preventive care is important for your health and wellness. Ask your health care provider about what schedule of regular examinations is right for you. What should I know about weight and diet? Eat a Healthy Diet  Eat plenty of vegetables, fruits, whole grains, low-fat dairy products, and lean protein.  Do not eat a lot of foods high in solid fats, added sugars, or salt.  Maintain a Healthy Weight Regular exercise can help you achieve or maintain a healthy weight. You should:  Do at least 150 minutes of exercise each week. The exercise should increase your heart rate  and make you sweat (moderate-intensity exercise).  Do strength-training exercises at least twice a week. Watch Your Levels of Cholesterol and Blood Lipids  Have your blood tested for lipids and cholesterol every 5 years starting at 57 years of age. If you are at high risk for heart disease, you should start having your blood tested when you are 57 years old. You may need to have your cholesterol levels checked more often if: ? Your lipid or cholesterol levels are high. ? You are older than 57 years of age. ? You are at high risk for heart disease. What should I know about cancer screening? Many types of cancers can be detected early and may often be prevented. Lung Cancer  You should be screened every year for lung cancer if: ? You are a current smoker who has smoked for at least 30 years. ? You are a former smoker who has quit within the past 15 years.  Talk to your health care provider about your screening options, when you should start screening, and how often you should be screened. Colorectal Cancer  Routine colorectal cancer screening usually begins at 57 years of age and should be repeated every 5-10 years until you are 57 years old. You may need to be screened more often if early forms of precancerous polyps or small growths are found. Your health care provider may recommend screening at an earlier age if you have risk factors for colon cancer.  Your health care provider may recommend using home test kits to check for hidden  blood in the stool.  A small camera at the end of a tube can be used to examine your colon (sigmoidoscopy or colonoscopy). This checks for the earliest forms of colorectal cancer. Prostate and Testicular Cancer  Depending on your age and overall health, your health care provider may do certain tests to screen for prostate and testicular cancer.  Talk to your health care provider about any symptoms or concerns you have about testicular or prostate  cancer. Skin Cancer  Check your skin from head to toe regularly.  Tell your health care provider about any new moles or changes in moles, especially if: ? There is a change in a mole's size, shape, or color. ? You have a mole that is larger than a pencil eraser.  Always use sunscreen. Apply sunscreen liberally and repeat throughout the day.  Protect yourself by wearing long sleeves, pants, a wide-brimmed hat, and sunglasses when outside. What should I know about heart disease, diabetes, and high blood pressure?  If you are 1-51 years of age, have your blood pressure checked every 3-5 years. If you are 66 years of age or older, have your blood pressure checked every year. You should have your blood pressure measured twice-once when you are at a hospital or clinic, and once when you are not at a hospital or clinic. Record the average of the two measurements. To check your blood pressure when you are not at a hospital or clinic, you can use: ? An automated blood pressure machine at a pharmacy. ? A home blood pressure monitor.  Talk to your health care provider about your target blood pressure.  If you are between 30-82 years old, ask your health care provider if you should take aspirin to prevent heart disease.  Have regular diabetes screenings by checking your fasting blood sugar level. ? If you are at a normal weight and have a low risk for diabetes, have this test once every three years after the age of 77. ? If you are overweight and have a high risk for diabetes, consider being tested at a younger age or more often.  A one-time screening for abdominal aortic aneurysm (AAA) by ultrasound is recommended for men aged 85-75 years who are current or former smokers. What should I know about preventing infection? Hepatitis B If you have a higher risk for hepatitis B, you should be screened for this virus. Talk with your health care provider to find out if you are at risk for hepatitis B  infection. Hepatitis C Blood testing is recommended for:  Everyone born from [birth year] through 1965.  Anyone with known risk factors for hepatitis C. Sexually Transmitted Diseases (STDs)  You should be screened each year for STDs including gonorrhea and chlamydia if: ? You are sexually active and are younger than 57 years of age. ? You are older than 57 years of age and your health care provider tells you that you are at risk for this type of infection. ? Your sexual activity has changed since you were last screened and you are at an increased risk for chlamydia or gonorrhea. Ask your health care provider if you are at risk.  Talk with your health care provider about whether you are at high risk of being infected with HIV. Your health care provider may recommend a prescription medicine to help prevent HIV infection. What else can I do?  Schedule regular health, dental, and eye exams.  Stay current with your vaccines (immunizations).  Do not use  any tobacco products, such as cigarettes, chewing tobacco, and e-cigarettes. If you need help quitting, ask your health care provider.  Limit alcohol intake to no more than 2 drinks per day. One drink equals 12 ounces of beer, 5 ounces of wine, or 1 ounces of hard liquor.  Do not use street drugs.  Do not share needles.  Ask your health care provider for help if you need support or information about quitting drugs.  Tell your health care provider if you often feel depressed.  Tell your health care provider if you have ever been abused or do not feel safe at home. This information is not intended to replace advice given to you by your health care provider. Make sure you discuss any questions you have with your health care provider. Document Released: 12/21/2007 Document Revised: 02/21/2016 Document Reviewed: 03/28/2015 Elsevier Interactive Patient Education  2019 Elsevier Inc.      Agustina Caroli, MD [facility]

## 2018-08-21 LAB — COMPREHENSIVE METABOLIC PANEL
ALK PHOS: 73 IU/L (ref 39–117)
ALT: 21 IU/L (ref 0–44)
AST: 16 IU/L (ref 0–40)
Albumin/Globulin Ratio: 2 (ref 1.2–2.2)
Albumin: 4.7 g/dL (ref 3.8–4.9)
BUN/Creatinine Ratio: 24 — ABNORMAL HIGH (ref 9–20)
BUN: 19 mg/dL (ref 6–24)
Bilirubin Total: 0.4 mg/dL (ref 0.0–1.2)
CALCIUM: 9.7 mg/dL (ref 8.7–10.2)
CHLORIDE: 100 mmol/L (ref 96–106)
CO2: 22 mmol/L (ref 20–29)
CREATININE: 0.78 mg/dL (ref 0.76–1.27)
GFR calc Af Amer: 117 mL/min/{1.73_m2} (ref >59)
GFR calc non Af Amer: 101 mL/min/{1.73_m2} (ref >59)
GLOBULIN, TOTAL: 2.4 g/dL (ref 1.5–4.5)
GLUCOSE: 94 mg/dL (ref 65–99)
Potassium: 4.7 mmol/L (ref 3.5–5.2)
SODIUM: 140 mmol/L (ref 134–144)
Total Protein: 7.1 g/dL (ref 6.0–8.5)

## 2018-08-21 LAB — CBC WITH DIFFERENTIAL/PLATELET
BASOS ABS: 0.1 10*3/uL (ref 0.0–0.2)
Basos: 1 %
EOS (ABSOLUTE): 0.3 10*3/uL (ref 0.0–0.4)
EOS: 5 %
HEMATOCRIT: 44.7 % (ref 37.5–51.0)
Hemoglobin: 15.3 g/dL (ref 13.0–17.7)
IMMATURE GRANULOCYTES: 0 %
Immature Grans (Abs): 0 10*3/uL (ref 0.0–0.1)
Lymphocytes Absolute: 2.3 10*3/uL (ref 0.7–3.1)
Lymphs: 37 %
MCH: 29.8 pg (ref 26.6–33.0)
MCHC: 34.2 g/dL (ref 31.5–35.7)
MCV: 87 fL (ref 79–97)
MONOCYTES: 9 %
MONOS ABS: 0.5 10*3/uL (ref 0.1–0.9)
NEUTROS PCT: 48 %
Neutrophils Absolute: 3 10*3/uL (ref 1.4–7.0)
Platelets: 341 10*3/uL (ref 150–450)
RBC: 5.14 x10E6/uL (ref 4.14–5.80)
RDW: 12.9 % (ref 11.6–15.4)
WBC: 6.2 10*3/uL (ref 3.4–10.8)

## 2018-08-21 LAB — LIPID PANEL
CHOLESTEROL TOTAL: 287 mg/dL — AB (ref 100–199)
Chol/HDL Ratio: 5.5 ratio — ABNORMAL HIGH (ref 0.0–5.0)
HDL: 52 mg/dL (ref >39)
LDL CALC: 198 mg/dL — AB (ref 0–99)
TRIGLYCERIDES: 184 mg/dL — AB (ref 0–149)
VLDL Cholesterol Cal: 37 mg/dL (ref 5–40)

## 2018-08-21 LAB — PSA: Prostate Specific Ag, Serum: 0.6 ng/mL (ref 0.0–4.0)

## 2018-08-21 LAB — TSH: TSH: 1.51 u[IU]/mL (ref 0.450–4.500)

## 2018-09-14 ENCOUNTER — Encounter: Payer: Self-pay | Admitting: Emergency Medicine

## 2018-09-14 NOTE — Telephone Encounter (Signed)
Crestor was just recently started on his last visit.  Stop medication and see if it makes a difference.  We will reassess after that.

## 2018-09-23 ENCOUNTER — Encounter: Payer: Self-pay | Admitting: Emergency Medicine

## 2018-09-23 ENCOUNTER — Other Ambulatory Visit: Payer: Self-pay | Admitting: Emergency Medicine

## 2018-09-23 MED ORDER — FENOFIBRATE 48 MG PO TABS: 48.0000 mg | ORAL_TABLET | Freq: Every day | ORAL | 5 refills | Status: DC

## 2018-09-23 NOTE — Telephone Encounter (Signed)
Not sure what the question is. Thanks.

## 2019-01-18 ENCOUNTER — Encounter: Payer: Self-pay | Admitting: Emergency Medicine

## 2019-01-20 ENCOUNTER — Encounter: Payer: Self-pay | Admitting: Emergency Medicine

## 2019-01-21 ENCOUNTER — Other Ambulatory Visit: Payer: Self-pay | Admitting: Emergency Medicine

## 2019-01-21 ENCOUNTER — Encounter: Payer: Self-pay | Admitting: Emergency Medicine

## 2019-01-21 MED ORDER — TADALAFIL 5 MG PO TABS: 5.0000 mg | ORAL_TABLET | Freq: Every day | ORAL | 5 refills | Status: DC | PRN

## 2019-01-21 NOTE — Telephone Encounter (Signed)
Prescription sent.  Thanks Jose Bennett.

## 2019-03-16 ENCOUNTER — Encounter: Payer: Self-pay | Admitting: Emergency Medicine

## 2019-03-16 ENCOUNTER — Other Ambulatory Visit: Payer: Self-pay | Admitting: Emergency Medicine

## 2019-03-16 NOTE — Telephone Encounter (Signed)
Requested medication (s) are due for refill today: yes  Requested medication (s) are on the active medication list: yes  Last refill:  12/21/2018  Future visit scheduled:no  Notes to clinic:  Review for refill   Requested Prescriptions  Pending Prescriptions Disp Refills   fenofibrate (TRICOR) 48 MG tablet [Pharmacy Med Name: FENOFIBRATE 48 MG TABLET] 90 tablet 1    Sig: Take 1 tablet (48 mg total) by mouth daily for 30 days.     Cardiovascular:  Antilipid - Fibric Acid Derivatives Failed - 03/16/2019  1:39 AM      Failed - Total Cholesterol in normal range and within 360 days    Cholesterol, Total  Date Value Ref Range Status  08/20/2018 287 (H) 100 - 199 mg/dL Final         Failed - LDL in normal range and within 360 days    LDL Calculated  Date Value Ref Range Status  08/20/2018 198 (H) 0 - 99 mg/dL Final         Failed - Triglycerides in normal range and within 360 days    Triglycerides  Date Value Ref Range Status  08/20/2018 184 (H) 0 - 149 mg/dL Final         Failed - ALT in normal range and within 180 days    ALT  Date Value Ref Range Status  08/20/2018 21 0 - 44 IU/L Final         Failed - AST in normal range and within 180 days    AST  Date Value Ref Range Status  08/20/2018 16 0 - 40 IU/L Final         Failed - Cr in normal range and within 180 days    Creatinine, Ser  Date Value Ref Range Status  08/20/2018 0.78 0.76 - 1.27 mg/dL Final         Failed - eGFR in normal range and within 180 days    GFR calc Af Amer  Date Value Ref Range Status  08/20/2018 117 >59 mL/min/1.73 Final   GFR calc non Af Amer  Date Value Ref Range Status  08/20/2018 101 >59 mL/min/1.73 Final         Passed - HDL in normal range and within 360 days    HDL  Date Value Ref Range Status  08/20/2018 52 >39 mg/dL Final         Passed - Valid encounter within last 12 months    Recent Outpatient Visits          6 months ago Routine general medical examination at a  health care facility   [facility], Ines Bloomer, MD   1 year ago Routine general medical examination at a health care facility   [facility], [place], MD   4 years ago Elevated blood pressure reading without diagnosis of hypertension   [facility], Darrick Penna, MD

## 2019-08-23 ENCOUNTER — Encounter: Payer: Self-pay | Admitting: Emergency Medicine

## 2019-08-23 ENCOUNTER — Other Ambulatory Visit: Payer: Self-pay

## 2019-08-23 ENCOUNTER — Telehealth: Payer: Self-pay | Admitting: Emergency Medicine

## 2019-08-23 ENCOUNTER — Ambulatory Visit (INDEPENDENT_AMBULATORY_CARE_PROVIDER_SITE_OTHER): Payer: BC Managed Care – PPO | Admitting: Emergency Medicine

## 2019-08-23 ENCOUNTER — Telehealth: Payer: Self-pay | Admitting: *Deleted

## 2019-08-23 VITALS — BP 140/78 | HR 61 | Temp 98.9°F | Resp 16 | Ht 75.0 in | Wt 312.0 lb

## 2019-08-23 DIAGNOSIS — Z13228 Encounter for screening for other metabolic disorders: Secondary | ICD-10-CM

## 2019-08-23 DIAGNOSIS — E785 Hyperlipidemia, unspecified: Secondary | ICD-10-CM | POA: Diagnosis not present

## 2019-08-23 DIAGNOSIS — Z1329 Encounter for screening for other suspected endocrine disorder: Secondary | ICD-10-CM

## 2019-08-23 DIAGNOSIS — Z0001 Encounter for general adult medical examination with abnormal findings: Secondary | ICD-10-CM | POA: Diagnosis not present

## 2019-08-23 DIAGNOSIS — E6609 Other obesity due to excess calories: Secondary | ICD-10-CM | POA: Diagnosis not present

## 2019-08-23 DIAGNOSIS — Z1211 Encounter for screening for malignant neoplasm of colon: Secondary | ICD-10-CM

## 2019-08-23 DIAGNOSIS — Z0000 Encounter for general adult medical examination without abnormal findings: Secondary | ICD-10-CM

## 2019-08-23 DIAGNOSIS — Z130 Encounter for screening for diseases of the blood and blood-forming organs and certain disorders involving the immune mechanism: Secondary | ICD-10-CM

## 2019-08-23 DIAGNOSIS — Z122 Encounter for screening for malignant neoplasm of respiratory organs: Secondary | ICD-10-CM

## 2019-08-23 DIAGNOSIS — Z125 Encounter for screening for malignant neoplasm of prostate: Secondary | ICD-10-CM

## 2019-08-23 DIAGNOSIS — Z6839 Body mass index (BMI) 39.0-39.9, adult: Secondary | ICD-10-CM

## 2019-08-23 NOTE — Progress Notes (Signed)
Wt Readings from Last 3 Encounters:  08/23/19 (!) 312 lb (141.5 kg)  08/20/18 298 lb (135.2 kg)  06/20/17 (!) 317 lb 6.4 oz (144 kg)   Jose Bennett 59 y.o.   Chief Complaint  Patient presents with  . Annual Exam    HISTORY OF PRESENT ILLNESS: This is a 59 y.o. male is here for annual exam. Has history of dyslipidemia. History of smoking for at least 30 years. Chose Cologuard for colon cancer screening. Has no complaints or medical concerns today.  HPI   Prior to Admission medications   Medication Sig Start Date End Date Taking? Authorizing Provider  Ascorbic Acid (VITAMIN C) 1000 MG tablet Take 1,000 mg by mouth daily.   Yes [provider]  aspirin EC 81 MG tablet Take 81 mg by mouth daily.   Yes [provider]  CALCIUM PO Take by mouth daily.   Yes [provider]  Cholecalciferol (VITAMIN D PO) Take by mouth once a week.   Yes [provider]  Magnesium 250 MG TABS Take by mouth daily.   Yes [provider]  OVER THE COUNTER MEDICATION    Yes [provider]  tadalafil (CIALIS) 5 MG tablet Take 1 tablet (5 mg total) by mouth daily as needed for erectile dysfunction. 01/21/19  Yes SagardiaInes Bloomer, MD  Zinc 50 MG CAPS Take by mouth.   Yes [provider]  fenofibrate (TRICOR) 48 MG tablet TAKE 1 TABLET (48 MG TOTAL) BY MOUTH DAILY FOR 30 DAYS. 03/16/19 04/15/19  Horald Pollen, MD    No Known Allergies  Patient Active Problem List   Diagnosis Date Noted  . ABNORMAL INVOLUNTARY MOVEMENTS 09/26/2009  . ADD 10/05/2007  . HYPERLIPIDEMIA NEC/NOS 03/23/2007    Past Medical History:  Diagnosis Date  . Hyperlipidemia     Past Surgical History:  Procedure Laterality Date  . BRAIN SURGERY    . KNEE ARTHROSCOPY     x2    Social History   Socioeconomic History  . Marital status: Married    Spouse name: Not on file  . Number of children: Not on file  . Years of education: Not on file  .  Highest education level: Not on file  Occupational History  . Not on file  Tobacco Use  . Smoking status: Former Smoker    Quit date: 07/23/2007    Years since quitting: 12.0  . Smokeless tobacco: Never Used  Substance and Sexual Activity  . Alcohol use: Yes    Alcohol/week: 7.0 standard drinks    Types: 7 Glasses of wine per week  . Drug use: No  . Sexual activity: Not on file  Other Topics Concern  . Not on file  Social History Narrative  . Not on file   Social Determinants of Health   Financial Resource Strain:   . Difficulty of Paying Living Expenses: Not on file  Food Insecurity:   . Worried About Charity fundraiser in the Last Year: Not on file  . Ran Out of Food in the Last Year: Not on file  Transportation Needs:   . Lack of Transportation (Medical): Not on file  . Lack of Transportation (Non-Medical): Not on file  Physical Activity:   . Days of Exercise per Week: Not on file  . Minutes of Exercise per Session: Not on file  Stress:   . Feeling of Stress : Not on file  Social Connections:   . Frequency of Communication  with Friends and Family: Not on file  . Frequency of Social Gatherings with Friends and Family: Not on file  . Attends Religious Services: Not on file  . Active Member of Clubs or Organizations: Not on file  . Attends Archivist Meetings: Not on file  . Marital Status: Not on file  Intimate Partner Violence:   . Fear of Current or Ex-Partner: Not on file  . Emotionally Abused: Not on file  . Physically Abused: Not on file  . Sexually Abused: Not on file    Family History  Problem Relation Age of Onset  . Prostate cancer Father   . Heart attack Father 45       smoker  . Cancer Father        LUNG  . Diabetes Mother   . Thyroid disease Sister   . Heart attack Maternal Grandfather 70  . Heart attack Paternal Grandfather 58     Review of Systems  Constitutional: Negative.  Negative for chills and fever.  HENT: Negative.   Negative for congestion and sore throat.   Respiratory: Negative.  Negative for cough and shortness of breath.   Cardiovascular: Negative.  Negative for chest pain and palpitations.  Gastrointestinal: Negative.  Negative for abdominal pain, diarrhea, nausea and vomiting.  Genitourinary: Negative.  Negative for dysuria, flank pain and frequency.  Musculoskeletal: Negative.  Negative for joint pain and myalgias.  Skin: Negative.  Negative for rash.  Neurological: Negative.  Negative for dizziness and headaches.  Endo/Heme/Allergies: Negative.   All other systems reviewed and are negative.  Today's Vitals   08/23/19 0831 08/23/19 0930  BP: (!) 146/82 140/78  Pulse: 61   Resp: 16   Temp: 98.9 F (37.2 C)   TempSrc: Temporal   SpO2: 92%   Weight: (!) 312 lb (141.5 kg)   Height: 6\' 3"  (1.905 m)    Body mass index is 39 kg/m.   Physical Exam Vitals reviewed.  Constitutional:      Appearance: Normal appearance. He is obese.  HENT:     Head: Normocephalic.     Mouth/Throat:     Mouth: Mucous membranes are moist.     Pharynx: Oropharynx is clear.  Eyes:     Extraocular Movements: Extraocular movements intact.     Conjunctiva/sclera: Conjunctivae normal.     Pupils: Pupils are equal, round, and reactive to light.  Cardiovascular:     Rate and Rhythm: Normal rate and regular rhythm.     Pulses: Normal pulses.     Heart sounds: Normal heart sounds.  Pulmonary:     Effort: Pulmonary effort is normal.     Breath sounds: Normal breath sounds.  Abdominal:     General: Bowel sounds are normal. There is no distension.     Palpations: Abdomen is soft. There is no mass.     Tenderness: There is no abdominal tenderness.  Musculoskeletal:        General: Normal range of motion.  Skin:    General: Skin is warm and dry.     Capillary Refill: Capillary refill takes less than 2 seconds.  Neurological:     General: No focal deficit present.     Mental Status: He is alert and oriented to  person, place, and time.  Psychiatric:        Mood and Affect: Mood normal.        Behavior: Behavior normal.      ASSESSMENT & PLAN: Jose Bennett was seen today for  annual exam.  Diagnoses and all orders for this visit:  Routine general medical examination at a health care facility  Screening for colon cancer -     Cologuard  Dyslipidemia -     Comprehensive metabolic panel -     Lipid panel -     Hemoglobin A1c  Class 2 obesity due to excess calories without serious comorbidity with body mass index (BMI) of 39.0 to 39.9 in adult -     Hemoglobin A1c  Screening for deficiency anemia -     CBC with Differential/Platelet  Screening for endocrine, metabolic and immunity disorder -     Comprehensive metabolic panel -     Hemoglobin A1c  Prostate cancer screening -     PSA  Encounter for screening for lung cancer -     CT CHEST LUNG CA SCREEN LOW DOSE W/O CM; Future    Patient Instructions       If you have lab work done today you will be contacted with your lab results within the next 2 weeks.  If you have not heard from [place] then please contact us. The fastest way to get your results is to register for My Chart.   IF you received an x-ray today, you will receive an invoice from [facility]. Please contact [facility] at [phone] with questions or concerns regarding your invoice.   IF you received labwork today, you will receive an invoice from [facility]. Please contact [facility] at [phone] with questions or concerns regarding your invoice.   Our billing staff will not be able to assist you with questions regarding bills from these companies.  You will be contacted with the lab results as soon as they are available. The fastest way to get your results is to activate your My Chart account. Instructions are located on the last page of this paperwork. If you have not heard from [place] regarding the results in 2 weeks, please contact this office.       Health Maintenance, Male Adopting a healthy lifestyle and getting preventive care are important in promoting health and wellness. Ask your health care provider about:  The right schedule for you to have regular tests and exams.  Things you can do on your own to prevent diseases and keep yourself healthy. What should I know about diet, weight, and exercise? Eat a healthy diet   Eat a diet that includes plenty of vegetables, fruits, low-fat dairy products, and lean protein.  Do not eat a lot of foods that are high in solid fats, added sugars, or sodium. Maintain a healthy weight Body mass index (BMI) is a measurement that can be used to identify possible weight problems. It estimates body fat based on height and weight. Your health care provider can help determine your BMI and help you achieve or maintain a healthy weight. Get regular exercise Get regular exercise. This is one of the most important things you can do for your health. Most adults should:  Exercise for at least 150 minutes each week. The exercise should increase your heart rate and make you sweat (moderate-intensity exercise).  Do strengthening exercises at least twice a week. This is in addition to the moderate-intensity exercise.  Spend less time sitting. Even light physical activity can be beneficial. Watch cholesterol and blood lipids Have your blood tested for lipids and cholesterol at 58 years of age, then have this test every 5 years. You may need to have your cholesterol levels checked more often  if:  Your lipid or cholesterol levels are high.  You are older than 58 years of age.  You are at high risk for heart disease. What should I know about cancer screening? Many types of cancers can be detected early and may often be prevented. Depending on your health history and family history, you may need to have cancer screening at various ages. This may include screening for:  Colorectal cancer.  Prostate  cancer.  Skin cancer.  Lung cancer. What should I know about heart disease, diabetes, and high blood pressure? Blood pressure and heart disease  High blood pressure causes heart disease and increases the risk of stroke. This is more likely to develop in people who have high blood pressure readings, are of African descent, or are overweight.  Talk with your health care provider about your target blood pressure readings.  Have your blood pressure checked: ? Every 3-5 years if you are 14-69 years of age. ? Every year if you are 70 years old or older.  If you are between the ages of 78 and 14 and are a current or former smoker, ask your health care provider if you should have a one-time screening for abdominal aortic aneurysm (AAA). Diabetes Have regular diabetes screenings. This checks your fasting blood sugar level. Have the screening done:  Once every three years after age 60 if you are at a normal weight and have a low risk for diabetes.  More often and at a younger age if you are overweight or have a high risk for diabetes. What should I know about preventing infection? Hepatitis B If you have a higher risk for hepatitis B, you should be screened for this virus. Talk with your health care provider to find out if you are at risk for hepatitis B infection. Hepatitis C Blood testing is recommended for:  Everyone born from [birth year] through 1965.  Anyone with known risk factors for hepatitis C. Sexually transmitted infections (STIs)  You should be screened each year for STIs, including gonorrhea and chlamydia, if: ? You are sexually active and are younger than 58 years of age. ? You are older than 58 years of age and your health care provider tells you that you are at risk for this type of infection. ? Your sexual activity has changed since you were last screened, and you are at increased risk for chlamydia or gonorrhea. Ask your health care provider if you are at risk.  Ask your  health care provider about whether you are at high risk for HIV. Your health care provider may recommend a prescription medicine to help prevent HIV infection. If you choose to take medicine to prevent HIV, you should first get tested for HIV. You should then be tested every 3 months for as long as you are taking the medicine. Follow these instructions at home: Lifestyle  Do not use any products that contain nicotine or tobacco, such as cigarettes, e-cigarettes, and chewing tobacco. If you need help quitting, ask your health care provider.  Do not use street drugs.  Do not share needles.  Ask your health care provider for help if you need support or information about quitting drugs. Alcohol use  Do not drink alcohol if your health care provider tells you not to drink.  If you drink alcohol: ? Limit how much you have to 0-2 drinks a day. ? Be aware of how much alcohol is in your drink. In the [place] one drink equals one 12 oz bottle  of beer (355 mL), one 5 oz glass of wine (148 mL), or one 1 oz glass of hard liquor (44 mL). General instructions  Schedule regular health, dental, and eye exams.  Stay current with your vaccines.  Tell your health care provider if: ? You often feel depressed. ? You have ever been abused or do not feel safe at home. Summary  Adopting a healthy lifestyle and getting preventive care are important in promoting health and wellness.  Follow your health care provider's instructions about healthy diet, exercising, and getting tested or screened for diseases.  Follow your health care provider's instructions on monitoring your cholesterol and blood pressure. This information is not intended to replace advice given to you by your health care provider. Make sure you discuss any questions you have with your health care provider. Document Revised: 06/17/2018 Document Reviewed: 06/17/2018 Elsevier Patient Education  2020 Elsevier Inc.      Agustina Caroli,  MD [facility]

## 2019-08-23 NOTE — Patient Instructions (Addendum)
   If you have lab work done today you will be contacted with your lab results within the next 2 weeks.  If you have not heard from us then please contact us. The fastest way to get your results is to register for My Chart.   IF you received an x-ray today, you will receive an invoice from [facility]. Please contact [facility] at [phone] with questions or concerns regarding your invoice.   IF you received labwork today, you will receive an invoice from [facility]. Please contact [facility] at [phone] with questions or concerns regarding your invoice.   Our billing staff will not be able to assist you with questions regarding bills from these companies.  You will be contacted with the lab results as soon as they are available. The fastest way to get your results is to activate your My Chart account. Instructions are located on the last page of this paperwork. If you have not heard from us regarding the results in 2 weeks, please contact this office.      Health Maintenance, Male Adopting a healthy lifestyle and getting preventive care are important in promoting health and wellness. Ask your health care provider about:  The right schedule for you to have regular tests and exams.  Things you can do on your own to prevent diseases and keep yourself healthy. What should I know about diet, weight, and exercise? Eat a healthy diet   Eat a diet that includes plenty of vegetables, fruits, low-fat dairy products, and lean protein.  Do not eat a lot of foods that are high in solid fats, added sugars, or sodium. Maintain a healthy weight Body mass index (BMI) is a measurement that can be used to identify possible weight problems. It estimates body fat based on height and weight. Your health care provider can help determine your BMI and help you achieve or maintain a healthy weight. Get regular exercise Get regular exercise. This is one of the most important things you  can do for your health. Most adults should:  Exercise for at least 150 minutes each week. The exercise should increase your heart rate and make you sweat (moderate-intensity exercise).  Do strengthening exercises at least twice a week. This is in addition to the moderate-intensity exercise.  Spend less time sitting. Even light physical activity can be beneficial. Watch cholesterol and blood lipids Have your blood tested for lipids and cholesterol at 58 years of age, then have this test every 5 years. You may need to have your cholesterol levels checked more often if:  Your lipid or cholesterol levels are high.  You are older than 58 years of age.  You are at high risk for heart disease. What should I know about cancer screening? Many types of cancers can be detected early and may often be prevented. Depending on your health history and family history, you may need to have cancer screening at various ages. This may include screening for:  Colorectal cancer.  Prostate cancer.  Skin cancer.  Lung cancer. What should I know about heart disease, diabetes, and high blood pressure? Blood pressure and heart disease  High blood pressure causes heart disease and increases the risk of stroke. This is more likely to develop in people who have high blood pressure readings, are of African descent, or are overweight.  Talk with your health care provider about your target blood pressure readings.  Have your blood pressure checked: ? Every 3-5 years if you are 18-39   years of age. ? Every year if you are 40 years old or older.  If you are between the ages of 65 and 75 and are a current or former smoker, ask your health care provider if you should have a one-time screening for abdominal aortic aneurysm (AAA). Diabetes Have regular diabetes screenings. This checks your fasting blood sugar level. Have the screening done:  Once every three years after age 45 if you are at a normal weight and have  a low risk for diabetes.  More often and at a younger age if you are overweight or have a high risk for diabetes. What should I know about preventing infection? Hepatitis B If you have a higher risk for hepatitis B, you should be screened for this virus. Talk with your health care provider to find out if you are at risk for hepatitis B infection. Hepatitis C Blood testing is recommended for:  Everyone born from 1945 through 1965.  Anyone with known risk factors for hepatitis C. Sexually transmitted infections (STIs)  You should be screened each year for STIs, including gonorrhea and chlamydia, if: ? You are sexually active and are younger than 58 years of age. ? You are older than 58 years of age and your health care provider tells you that you are at risk for this type of infection. ? Your sexual activity has changed since you were last screened, and you are at increased risk for chlamydia or gonorrhea. Ask your health care provider if you are at risk.  Ask your health care provider about whether you are at high risk for HIV. Your health care provider may recommend a prescription medicine to help prevent HIV infection. If you choose to take medicine to prevent HIV, you should first get tested for HIV. You should then be tested every 3 months for as long as you are taking the medicine. Follow these instructions at home: Lifestyle  Do not use any products that contain nicotine or tobacco, such as cigarettes, e-cigarettes, and chewing tobacco. If you need help quitting, ask your health care provider.  Do not use street drugs.  Do not share needles.  Ask your health care provider for help if you need support or information about quitting drugs. Alcohol use  Do not drink alcohol if your health care provider tells you not to drink.  If you drink alcohol: ? Limit how much you have to 0-2 drinks a day. ? Be aware of how much alcohol is in your drink. In the [place] one drink equals one 12  oz bottle of beer (355 mL), one 5 oz glass of wine (148 mL), or one 1 oz glass of hard liquor (44 mL). General instructions  Schedule regular health, dental, and eye exams.  Stay current with your vaccines.  Tell your health care provider if: ? You often feel depressed. ? You have ever been abused or do not feel safe at home. Summary  Adopting a healthy lifestyle and getting preventive care are important in promoting health and wellness.  Follow your health care provider's instructions about healthy diet, exercising, and getting tested or screened for diseases.  Follow your health care provider's instructions on monitoring your cholesterol and blood pressure. This information is not intended to replace advice given to you by your health care provider. Make sure you discuss any questions you have with your health care provider. Document Revised: 06/17/2018 Document Reviewed: 06/17/2018 Elsevier Patient Education  2020 Elsevier Inc.  

## 2019-08-23 NOTE — Telephone Encounter (Signed)
Faxed requisition for Cologuard to [facility]. Confirmation page 9:48 am.

## 2019-08-23 NOTE — Telephone Encounter (Signed)
Good afternoon,  For the lung cancer screening, the patients insurance is telling me that a secondary diagnosis is required along with the Z12.2.  Thank you

## 2019-08-24 ENCOUNTER — Encounter: Payer: Self-pay | Admitting: Emergency Medicine

## 2019-08-24 ENCOUNTER — Other Ambulatory Visit: Payer: Self-pay | Admitting: Emergency Medicine

## 2019-08-24 DIAGNOSIS — E785 Hyperlipidemia, unspecified: Secondary | ICD-10-CM

## 2019-08-24 LAB — COMPREHENSIVE METABOLIC PANEL
ALT: 47 IU/L — ABNORMAL HIGH (ref 0–44)
AST: 26 IU/L (ref 0–40)
Albumin/Globulin Ratio: 2.2 (ref 1.2–2.2)
Albumin: 4.7 g/dL (ref 3.8–4.9)
Alkaline Phosphatase: 72 IU/L (ref 39–117)
BUN/Creatinine Ratio: 15 (ref 9–20)
BUN: 12 mg/dL (ref 6–24)
Bilirubin Total: 0.5 mg/dL (ref 0.0–1.2)
CO2: 24 mmol/L (ref 20–29)
Calcium: 9.6 mg/dL (ref 8.7–10.2)
Chloride: 102 mmol/L (ref 96–106)
Creatinine, Ser: 0.81 mg/dL (ref 0.76–1.27)
GFR calc Af Amer: 114 mL/min/{1.73_m2} (ref >59)
GFR calc non Af Amer: 99 mL/min/{1.73_m2} (ref >59)
Globulin, Total: 2.1 g/dL (ref 1.5–4.5)
Glucose: 111 mg/dL — ABNORMAL HIGH (ref 65–99)
Potassium: 4.4 mmol/L (ref 3.5–5.2)
Sodium: 138 mmol/L (ref 134–144)
Total Protein: 6.8 g/dL (ref 6.0–8.5)

## 2019-08-24 LAB — LIPID PANEL
Chol/HDL Ratio: 6.7 ratio — ABNORMAL HIGH (ref 0.0–5.0)
Cholesterol, Total: 336 mg/dL — ABNORMAL HIGH (ref 100–199)
HDL: 50 mg/dL (ref >39)
LDL Chol Calc (NIH): 244 mg/dL — ABNORMAL HIGH (ref 0–99)
Triglycerides: 208 mg/dL — ABNORMAL HIGH (ref 0–149)
VLDL Cholesterol Cal: 42 mg/dL — ABNORMAL HIGH (ref 5–40)

## 2019-08-24 LAB — CBC WITH DIFFERENTIAL/PLATELET
Basophils Absolute: 0.1 10*3/uL (ref 0.0–0.2)
Basos: 1 %
EOS (ABSOLUTE): 0.2 10*3/uL (ref 0.0–0.4)
Eos: 5 %
Hematocrit: 44 % (ref 37.5–51.0)
Hemoglobin: 15.3 g/dL (ref 13.0–17.7)
Immature Grans (Abs): 0 10*3/uL (ref 0.0–0.1)
Immature Granulocytes: 1 %
Lymphocytes Absolute: 1.8 10*3/uL (ref 0.7–3.1)
Lymphs: 36 %
MCH: 31.4 pg (ref 26.6–33.0)
MCHC: 34.8 g/dL (ref 31.5–35.7)
MCV: 90 fL (ref 79–97)
Monocytes Absolute: 0.4 10*3/uL (ref 0.1–0.9)
Monocytes: 8 %
Neutrophils Absolute: 2.5 10*3/uL (ref 1.4–7.0)
Neutrophils: 49 %
Platelets: 334 10*3/uL (ref 150–450)
RBC: 4.88 x10E6/uL (ref 4.14–5.80)
RDW: 12.7 % (ref 11.6–15.4)
WBC: 5.1 10*3/uL (ref 3.4–10.8)

## 2019-08-24 LAB — HEMOGLOBIN A1C
Est. average glucose Bld gHb Est-mCnc: 123 mg/dL
Hgb A1c MFr Bld: 5.9 % — ABNORMAL HIGH (ref 4.8–5.6)

## 2019-08-24 LAB — PSA: Prostate Specific Ag, Serum: 0.5 ng/mL (ref 0.0–4.0)

## 2019-08-24 MED ORDER — ROSUVASTATIN CALCIUM 20 MG PO TABS: 20.0000 mg | ORAL_TABLET | Freq: Every day | ORAL | 3 refills | Status: DC

## 2019-08-24 NOTE — Progress Notes (Signed)
Lab Results  Component Value Date   CHOL 336 (H) 08/23/2019   HDL 50 08/23/2019   LDLCALC 244 (H) 08/23/2019   LDLDIRECT 188.0 10/02/2009   TRIG 208 (H) 08/23/2019   CHOLHDL 6.7 (H) 08/23/2019

## 2019-09-07 ENCOUNTER — Encounter: Payer: Self-pay | Admitting: *Deleted

## 2019-09-07 ENCOUNTER — Other Ambulatory Visit: Payer: Self-pay | Admitting: *Deleted

## 2019-09-07 DIAGNOSIS — Z87891 Personal history of nicotine dependence: Secondary | ICD-10-CM

## 2019-09-07 LAB — COLOGUARD: COLOGUARD: NEGATIVE

## 2019-09-08 ENCOUNTER — Telehealth: Payer: Self-pay | Admitting: Acute Care

## 2019-09-08 LAB — COLOGUARD: Cologuard: NEGATIVE

## 2019-09-10 NOTE — Telephone Encounter (Signed)
Spoke with pt and advised that charge for [facility] is $150 if insurance does not pay. Pt states that he is ok with paying this. I also advised pt that we will precert the CT before it is done. SDMV was also changed to a televisit for 09/20/19 at 9:00. Pt verbalized understanding. Nothing further needed at this time.

## 2019-09-20 ENCOUNTER — Ambulatory Visit (INDEPENDENT_AMBULATORY_CARE_PROVIDER_SITE_OTHER): Payer: BC Managed Care – PPO | Admitting: Acute Care

## 2019-09-20 ENCOUNTER — Ambulatory Visit
Admission: RE | Admit: 2019-09-20 | Discharge: 2019-09-20 | Disposition: A | Discharge status: Home / Self Care | Payer: BC Managed Care – PPO | Source: Ambulatory Visit | Attending: Acute Care | Admitting: Acute Care

## 2019-09-20 ENCOUNTER — Other Ambulatory Visit: Payer: Self-pay

## 2019-09-20 ENCOUNTER — Telehealth: Payer: Self-pay | Admitting: Acute Care

## 2019-09-20 ENCOUNTER — Encounter: Payer: Self-pay | Admitting: Acute Care

## 2019-09-20 DIAGNOSIS — Z87891 Personal history of nicotine dependence: Secondary | ICD-10-CM | POA: Diagnosis not present

## 2019-09-20 NOTE — Patient Instructions (Signed)
Thank you for participating in the [facility]. It was our pleasure to meet you today. We will call you with the results of your scan within the next few days. Your scan will be assigned a Lung RADS category score by the physicians reading the scans.  This Lung RADS score determines follow up scanning.  See below for description of categories, and follow up screening recommendations. We will be in touch to schedule your follow up screening annually or based on recommendations of our providers. We will fax a copy of your scan results to your Primary Care Physician, or the physician who referred you to the program, to ensure they have the results. Please call the office if you have any questions or concerns regarding your scanning experience or results.  Our office number is [phone]. Please speak with Denise Phelps, RN. She is our Lung Cancer Screening RN. If she is unavailable when you call, please have the office staff send her a message. She will return your call at her earliest convenience. Remember, if your scan is normal, we will scan you annually as long as you continue to meet the criteria for the program. (Age 55-77, Current smoker or smoker who has quit within the last 15 years). If you are a smoker, remember, quitting is the single most powerful action that you can take to decrease your risk of lung cancer and other pulmonary, breathing related problems. We know quitting is hard, and we are here to help.  Please let us know if there is anything we can do to help you meet your goal of quitting. If you are a former smoker, congratulations. We are proud of you! Remain smoke free! Remember you can refer friends or family members through the number above.  We will screen them to make sure they meet criteria for the program. Thank you for helping us take better care of you by participating in Lung Screening.  Lung RADS Categories:  Lung RADS 1: no nodules  or definitely non-concerning nodules.  Recommendation is for a repeat annual scan in 12 months.  Lung RADS 2:  nodules that are non-concerning in appearance and behavior with a very low likelihood of becoming an active cancer. Recommendation is for a repeat annual scan in 12 months.  Lung RADS 3: nodules that are probably non-concerning , includes nodules with a low likelihood of becoming an active cancer.  Recommendation is for a 6-month repeat screening scan. Often noted after an upper respiratory illness. We will be in touch to make sure you have no questions, and to schedule your 6-month scan.  Lung RADS 4 A: nodules with concerning findings, recommendation is most often for a follow up scan in 3 months or additional testing based on our provider's assessment of the scan. We will be in touch to make sure you have no questions and to schedule the recommended 3 month follow up scan.  Lung RADS 4 B:  indicates findings that are concerning. We will be in touch with you to schedule additional diagnostic testing based on our provider's  assessment of the scan.   

## 2019-09-20 NOTE — Progress Notes (Signed)
Shared Decision Making Visit Lung Cancer Screening Program [phone])   Eligibility:  Age 59 y.o.  Pack Years Smoking History Calculation 68 pack year smoking history (# packs/per year x # years smoked)  Recent History of coughing up blood  no  Unexplained weight loss? no ( >Than 15 pounds within the last 6 months )  Prior History Lung / other cancer no (Diagnosis within the last 5 years already requiring surveillance chest CT Scans).  Smoking Status Former Smoker  Former Smokers: Years since quit: 6 years  Quit Date: 2015  Visit Components:  Discussion included one or more decision making aids. yes  Discussion included risk/benefits of screening. yes  Discussion included potential follow up diagnostic testing for abnormal scans. yes  Discussion included meaning and risk of over diagnosis. yes  Discussion included meaning and risk of False Positives. yes  Discussion included meaning of total radiation exposure. yes  Counseling Included:  Importance of adherence to annual lung cancer LDCT screening. yes  Impact of comorbidities on ability to participate in the program. yes  Ability and willingness to under diagnostic treatment. yes  Smoking Cessation Counseling:  Current Smokers:   Discussed importance of smoking cessation. NA  Information about tobacco cessation classes and interventions provided to patient. yes  Patient provided with "ticket" for LDCT Scan. Sent virtually  Symptomatic Patient. no  Counseling NA  Diagnosis Code: Tobacco Use Z72.0  Asymptomatic Patient yes  Counseling (Intermediate counseling: > three minutes counseling) [id number]  Former Smokers:   Discussed the importance of maintaining cigarette abstinence. yes  Diagnosis Code: Personal History of Nicotine Dependence. [id number]  Information about tobacco cessation classes and interventions provided to patient. Yes  Patient provided with "ticket" for LDCT Scan. yes  Written Order for  Lung Cancer Screening with LDCT placed in Epic. Yes (CT Chest Lung Cancer Screening Low Dose W/O CM) [id number] Z12.2-Screening of respiratory organs Z87.891-Personal history of nicotine dependence  I spent 25 minutes of face to face time with [name] discussing the risks and benefits of lung cancer screening. We viewed a power point together that explained in detail the above noted topics. We took the time to pause the power point at intervals to allow for questions to be asked and answered to ensure understanding. We discussed that he had taken the single most powerful action possible to decrease his risk of developing lung cancer when he quit smoking. I counseled him to remain smoke free, and to contact me if he ever had the desire to smoke again so that I can provide resources and tools to help support the effort to remain smoke free. We discussed the time and location of the scan, and that either  Doroteo Glassman RN or I will call with the results within  24-48 hours of receiving them. He has my card and contact information in the event he needs to speak with me, in addition to a copy of the power point we reviewed as a resource. He verbalized understanding of all of the above and had no further questions upon leaving the office.     I explained to the patient that there has been a high incidence of coronary artery disease noted on these exams. I explained that this is a non-gated exam therefore degree or severity cannot be determined. This patient is currently on statin therapy. I have asked the patient to follow-up with their PCP regarding any incidental finding of coronary artery disease and management with diet or medication as they feel  is clinically indicated. The patient verbalized understanding of the above and had no further questions.     Jose Spatz, NP 09/20/2019

## 2019-09-20 NOTE — Telephone Encounter (Signed)
Received call report from [facility] Radiology:   IMPRESSION: 1. Lung-RADS Category 1-S, negative. Continue annual screening with low-dose chest CT without contrast in 12 months. 2. The "S" modifier above refers to potentially clinically significant non lung cancer related findings. Specifically, indeterminate 2.3 cm liver dome lesion. MRI abdomen without and with IV contrast indicated for further evaluation. This recommendation follows ACR consensus guidelines: Managing Incidental Findings on Abdominal CT: White Paper of the ACR Incidental Findings Committee. J Am Coll Radiol 2010;7:754-773 3. Three-vessel coronary atherosclerosis. 4. Diffuse hepatic steatosis. 5. Aortic Atherosclerosis (ICD10-I70.0) and Emphysema (ICD10-J43.9).  Will forward to Eric Form, NP

## 2019-09-23 ENCOUNTER — Other Ambulatory Visit: Payer: Self-pay | Admitting: Emergency Medicine

## 2019-09-23 ENCOUNTER — Other Ambulatory Visit: Payer: Self-pay | Admitting: *Deleted

## 2019-09-23 ENCOUNTER — Telehealth: Payer: Self-pay | Admitting: Emergency Medicine

## 2019-09-23 DIAGNOSIS — Z87891 Personal history of nicotine dependence: Secondary | ICD-10-CM

## 2019-09-23 DIAGNOSIS — K769 Liver disease, unspecified: Secondary | ICD-10-CM

## 2019-09-23 NOTE — Progress Notes (Signed)
Please see the results of the low dose CT Chest for lung cancer screening. I have called these results to the patient. I explained that his low dose scan was read as a Lung RADS 1, negative study: no nodules or definitely benign nodules. Radiology recommendation is for a repeat LDCT in 12 months. I also explained that there was an incidental finding of an indeterminate 2.3 cm liver dome lesion. Radiology recommends a MRI abdomen with and without IV contrast  for further follow up. I told him I would call his PCP Dr. Mitchel Honour and ask him to order the MRI of his abdomen for follow up. The patient did request an open MRI if at all possible. He has anxiety in enclosed spaces.  He is in agreement with this plan. I did explain that this is an indeterminate finding, but that we would be remiss if we did not follow up as recommended.  I have called and left a message at [facility] Primary care for Dr. Mitchel Honour. I left the message with Ledora Bottcher , who stated she will personally make sure someone from clinical reads the message . I have left my contact number in the event they need to speak with me further or in the event I can be of further assistance.  [name] verbalized understanding of the above.

## 2019-09-23 NOTE — Telephone Encounter (Signed)
Judson Roch ,np  Look at ct scan on the 3/15 there was a lesion . Recommend mrI  She has talked to patient already  Pt requested on an open mri.  [facility]

## 2019-09-23 NOTE — Progress Notes (Signed)
I have called the patient with the results of his low dose Ct. I explained that from a Lung Cancer Perspective his scan was read as a Lung RADS 1, negative study: no nodules or definitely benign nodules. Radiology recommendation is for a repeat LDCT in 12 months. We will call him in 09/2020 to schedule his annual scan.  I also explained that there was an incidental finding of an indeterminate 2.3 cm liver dome lesion. MRI abdomen without and with IV contrast indicated for further evaluation. I explained that I will call his PCP Dr. Mitchel Honour and let him know about the finding and ask that he places the order for the follow up MRI of the abdomen that is recommended by radiology.  I told him once I call his PCP he should receive a call to schedule further evaluation of this area. We discussed that this may be nothing, but that we need to follow up and make sure we further evaluate the area on his liver. He is in agreement with this plan.  Langley Gauss, please schedule follow up LDCT for 09/2020, and fax the results to the PCP. Thanks so much

## 2019-09-24 NOTE — Telephone Encounter (Signed)
Noted.   If the pt has not already been scheduled. Do you mind assisting with a open MRI?

## 2019-09-28 ENCOUNTER — Other Ambulatory Visit: Payer: Self-pay | Admitting: Emergency Medicine

## 2019-09-28 NOTE — Telephone Encounter (Signed)
Requested Prescriptions  Pending Prescriptions Disp Refills  . tadalafil (CIALIS) 5 MG tablet [Pharmacy Med Name: TADALAFIL 5 MG TABLET] 10 tablet 5    Sig: TAKE 1 TABLET BY MOUTH DAILY AS NEEDED FOR ERECTILE DYSFUNCTION     Urology: Erectile Dysfunction Agents Failed - 09/28/2019  1:32 AM      Failed - Last BP in normal range    BP Readings from Last 1 Encounters:  08/23/19 140/78         Passed - Valid encounter within last 12 months    Recent Outpatient Visits          1 month ago Routine general medical examination at a health care facility   [facility] [place], Ines Bloomer, MD   1 year ago Routine general medical examination at a health care facility   [facility] [place], Ines Bloomer, MD   2 years ago Routine general medical examination at a health care facility   [facility] [place], Ines Bloomer, MD      Future Appointments            In 11 months [place], Ines Bloomer, MD [facility] [place], [place]           BP recorded during [place] exam on 08/20/2018 was 138/78 and on 08/23/2019 was 140/78.

## 2019-10-04 ENCOUNTER — Encounter: Payer: Self-pay | Admitting: Emergency Medicine

## 2019-10-25 ENCOUNTER — Encounter: Payer: Self-pay | Admitting: Emergency Medicine

## 2019-11-28 ENCOUNTER — Other Ambulatory Visit: Payer: BC Managed Care – PPO

## 2019-12-22 ENCOUNTER — Other Ambulatory Visit: Payer: Self-pay

## 2019-12-22 ENCOUNTER — Ambulatory Visit
Admission: RE | Admit: 2019-12-22 | Discharge: 2019-12-22 | Disposition: A | Discharge status: Home / Self Care | Payer: BC Managed Care – PPO | Source: Ambulatory Visit | Attending: Emergency Medicine | Admitting: Emergency Medicine

## 2019-12-22 DIAGNOSIS — K769 Liver disease, unspecified: Secondary | ICD-10-CM

## 2019-12-22 MED ORDER — GADOBENATE DIMEGLUMINE 529 MG/ML IV SOLN
20.0000 mL | Freq: Once | INTRAVENOUS | Status: AC | PRN
  Administered 2019-12-22: 20 mL via INTRAVENOUS

## 2020-08-13 ENCOUNTER — Other Ambulatory Visit: Payer: Self-pay | Admitting: Emergency Medicine

## 2020-08-13 DIAGNOSIS — E785 Hyperlipidemia, unspecified: Secondary | ICD-10-CM

## 2020-08-13 NOTE — Telephone Encounter (Signed)
Requested Prescriptions  Pending Prescriptions Disp Refills  . rosuvastatin (CRESTOR) 20 MG tablet [Pharmacy Med Name: ROSUVASTATIN CALCIUM 20 MG TAB] 90 tablet 0    Sig: TAKE 1 TABLET BY MOUTH EVERY DAY     Cardiovascular:  Antilipid - Statins Failed - 08/13/2020 12:45 AM      Failed - Total Cholesterol in normal range and within 360 days    Cholesterol, Total  Date Value Ref Range Status  08/23/2019 336 (H) 100 - 199 mg/dL Final         Failed - LDL in normal range and within 360 days    LDL Chol Calc (NIH)  Date Value Ref Range Status  08/23/2019 244 (H) 0 - 99 mg/dL Final   Direct LDL  Date Value Ref Range Status  10/02/2009 188.0 mg/dL Final    Comment:    See lab report for associated comment(s)         Failed - Triglycerides in normal range and within 360 days    Triglycerides  Date Value Ref Range Status  08/23/2019 208 (H) 0 - 149 mg/dL Final         Passed - HDL in normal range and within 360 days    HDL  Date Value Ref Range Status  08/23/2019 50 >39 mg/dL Final         Passed - Patient is not pregnant      Passed - Valid encounter within last 12 months    Recent Outpatient Visits          11 months ago Routine general medical examination at a health care facility   [facility] [place], Ines Bloomer, MD   1 year ago Routine general medical examination at a health care facility   [facility] [place], Ines Bloomer, MD   3 years ago Routine general medical examination at a health care facility   [facility], Ines Bloomer, MD      Future Appointments            In 1 month [place], Ines Bloomer, MD [facility] at [place], [facility]

## 2020-08-25 ENCOUNTER — Encounter: Payer: BC Managed Care – PPO | Admitting: Emergency Medicine

## 2020-09-19 ENCOUNTER — Encounter: Payer: Self-pay | Admitting: Emergency Medicine

## 2020-09-19 ENCOUNTER — Other Ambulatory Visit: Payer: Self-pay

## 2020-09-19 ENCOUNTER — Ambulatory Visit (INDEPENDENT_AMBULATORY_CARE_PROVIDER_SITE_OTHER): Payer: BC Managed Care – PPO | Admitting: Emergency Medicine

## 2020-09-19 VITALS — BP 150/90 | HR 72 | Temp 97.9°F | Resp 16 | Ht 75.0 in | Wt 337.0 lb

## 2020-09-19 DIAGNOSIS — K760 Fatty (change of) liver, not elsewhere classified: Secondary | ICD-10-CM

## 2020-09-19 DIAGNOSIS — I2510 Atherosclerotic heart disease of native coronary artery without angina pectoris: Secondary | ICD-10-CM | POA: Diagnosis not present

## 2020-09-19 DIAGNOSIS — I700 Atherosclerosis of aorta: Secondary | ICD-10-CM

## 2020-09-19 DIAGNOSIS — I10 Essential (primary) hypertension: Secondary | ICD-10-CM

## 2020-09-19 DIAGNOSIS — I152 Hypertension secondary to endocrine disorders: Secondary | ICD-10-CM | POA: Insufficient documentation

## 2020-09-19 DIAGNOSIS — Z0001 Encounter for general adult medical examination with abnormal findings: Secondary | ICD-10-CM

## 2020-09-19 DIAGNOSIS — J432 Centrilobular emphysema: Secondary | ICD-10-CM | POA: Insufficient documentation

## 2020-09-19 DIAGNOSIS — E785 Hyperlipidemia, unspecified: Secondary | ICD-10-CM

## 2020-09-19 LAB — CMP14+EGFR
ALT: 57 IU/L — ABNORMAL HIGH (ref 0–44)
AST: 34 IU/L (ref 0–40)
Albumin/Globulin Ratio: 2.1 (ref 1.2–2.2)
Albumin: 4.8 g/dL (ref 3.8–4.9)
Alkaline Phosphatase: 83 IU/L (ref 44–121)
BUN/Creatinine Ratio: 19 (ref 9–20)
BUN: 16 mg/dL (ref 6–24)
Bilirubin Total: 0.7 mg/dL (ref 0.0–1.2)
CO2: 24 mmol/L (ref 20–29)
Calcium: 9.5 mg/dL (ref 8.7–10.2)
Chloride: 98 mmol/L (ref 96–106)
Creatinine, Ser: 0.86 mg/dL (ref 0.76–1.27)
Globulin, Total: 2.3 g/dL (ref 1.5–4.5)
Glucose: 144 mg/dL — ABNORMAL HIGH (ref 65–99)
Potassium: 5 mmol/L (ref 3.5–5.2)
Sodium: 138 mmol/L (ref 134–144)
Total Protein: 7.1 g/dL (ref 6.0–8.5)
eGFR: 100 mL/min/{1.73_m2} (ref >59)

## 2020-09-19 LAB — HEMOGLOBIN A1C
Est. average glucose Bld gHb Est-mCnc: 151 mg/dL
Hgb A1c MFr Bld: 6.9 % — ABNORMAL HIGH (ref 4.8–5.6)

## 2020-09-19 LAB — LIPID PANEL
Chol/HDL Ratio: 4.2 ratio (ref 0.0–5.0)
Cholesterol, Total: 224 mg/dL — ABNORMAL HIGH (ref 100–199)
HDL: 53 mg/dL (ref >39)
LDL Chol Calc (NIH): 140 mg/dL — ABNORMAL HIGH (ref 0–99)
Triglycerides: 171 mg/dL — ABNORMAL HIGH (ref 0–149)
VLDL Cholesterol Cal: 31 mg/dL (ref 5–40)

## 2020-09-19 MED ORDER — AMLODIPINE BESYLATE 5 MG PO TABS: 5.0000 mg | ORAL_TABLET | Freq: Every day | ORAL | 3 refills | Status: DC

## 2020-09-19 NOTE — Assessment & Plan Note (Signed)
Incidental finding on chest CT scan done  for lung cancer screening.  Three-vessel coronary atherosclerosis.  Clinically asymptomatic.  Patient's functional capacity at an acceptable level.  However he is morbidly obese with history of dyslipidemia.  Patient already taking rosuvastatin 20 mg daily and daily baby aspirin.  Elevated blood pressure.  We will start amlodipine 5 mg daily.  Needs cardiology evaluation.  Referral placed today.

## 2020-09-19 NOTE — Progress Notes (Signed)
BP Readings from Last 3 Encounters:  09/19/20 (!) 150/90  08/23/19 140/78  08/20/18 138/78

## 2020-09-19 NOTE — Progress Notes (Signed)
Jose Bennett 60 y.o.   Chief Complaint  Patient presents with  . Annual Exam    HISTORY OF PRESENT ILLNESS: This is a 61 y.o. male here for annual exam. CT scan of chest done last year showed the following: IMPRESSION: 1. Lung-RADS Category 1-S, negative. Continue annual screening with low-dose chest CT without contrast in 12 months. 2. The "S" modifier above refers to potentially clinically significant non lung cancer related findings. Specifically, indeterminate 2.3 cm liver dome lesion. MRI abdomen without and with IV contrast indicated for further evaluation. This recommendation follows ACR consensus guidelines: Managing Incidental Findings on Abdominal CT: White Paper of the ACR Incidental Findings Committee. J Am Coll Radiol 2010;7:754-773 3. Three-vessel coronary atherosclerosis. 4. Diffuse hepatic steatosis. 5. Aortic Atherosclerosis (ICD10-I70.0) and Emphysema (ICD10-J43.9). Follow-up abdominal MRI showed the following: IMPRESSION: Benign hemangioma in the RIGHT hepatic lobe corresponds to indeterminate lesion on comparison CT.   Electronically Signed   By: Suzy Bouchard M.D.   On: 12/22/2019 11:18 Patient states he feels great.  However he has gained 25 pounds since last time I saw him 1 year ago. Has mild dyspnea on exertion.  Denies chest pain or chest pain on exertion. No longer smoking. Presently taking rosuvastatin 20 mg daily and daily baby aspirin. Cologuard test done last year was negative.  HPI   Prior to Admission medications   Medication Sig Start Date End Date Taking? Authorizing Provider  Ascorbic Acid (VITAMIN C) 1000 MG tablet Take 1,000 mg by mouth daily.   Yes [provider]  aspirin EC 81 MG tablet Take 81 mg by mouth daily.   Yes [provider]  CALCIUM PO Take by mouth daily.   Yes [provider]  Cholecalciferol (VITAMIN D PO) Take by mouth once a week.   Yes [provider]  Magnesium 250  MG TABS Take by mouth daily.   Yes [provider]  rosuvastatin (CRESTOR) 20 MG tablet TAKE 1 TABLET BY MOUTH EVERY DAY 08/13/20  Yes Rainer Mounce, Ines Bloomer, MD  tadalafil (CIALIS) 5 MG tablet TAKE 1 TABLET BY MOUTH DAILY AS NEEDED FOR ERECTILE DYSFUNCTION 09/28/19  Yes Horald Pollen, MD  Zinc 50 MG CAPS Take by mouth.   Yes [provider]  OVER THE COUNTER MEDICATION     [provider]    No Known Allergies  Patient Active Problem List   Diagnosis Date Noted  . ABNORMAL INVOLUNTARY MOVEMENTS 09/26/2009  . ADD 10/05/2007  . HYPERLIPIDEMIA NEC/NOS 03/23/2007    Past Medical History:  Diagnosis Date  . Hyperlipidemia     Past Surgical History:  Procedure Laterality Date  . BRAIN SURGERY    . KNEE ARTHROSCOPY     x2    Social History   Socioeconomic History  . Marital status: Married    Spouse name: Not on file  . Number of children: Not on file  . Years of education: Not on file  . Highest education level: Not on file  Occupational History  . Not on file  Tobacco Use  . Smoking status: Former Smoker    Packs/day: 2.00    Years: 34.00    Pack years: 68.00    Quit date: 07/23/2007    Years since quitting: 13.1  . Smokeless tobacco: Never Used  Substance and Sexual Activity  . Alcohol use: Yes    Alcohol/week: 7.0 standard drinks    Types: 7 Glasses of wine per week  . Drug use: No  .  Sexual activity: Not on file  Other Topics Concern  . Not on file  Social History Narrative  . Not on file   Social Determinants of Health   Financial Resource Strain: Not on file  Food Insecurity: Not on file  Transportation Needs: Not on file  Physical Activity: Not on file  Stress: Not on file  Social Connections: Not on file  Intimate Partner Violence: Not on file    Family History  Problem Relation Age of Onset  . Prostate cancer Father   . Heart attack Father 13       smoker  . Cancer Father        LUNG  . Diabetes Mother   .  Thyroid disease Sister   . Heart attack Maternal Grandfather 70  . Heart attack Paternal Grandfather 8     Review of Systems  Constitutional: Negative for chills and fever.  HENT: Negative.  Negative for congestion and sore throat.   Respiratory: Positive for shortness of breath (Mild dyspnea on exertion). Negative for cough.   Cardiovascular: Negative.  Negative for chest pain and palpitations.  Gastrointestinal: Negative.  Negative for abdominal pain, diarrhea, nausea and vomiting.  Genitourinary: Negative.  Negative for dysuria and hematuria.  Skin: Negative.   Neurological: Negative.  Negative for dizziness and headaches.  All other systems reviewed and are negative.   Today's Vitals   09/19/20 0758  BP: (!) 150/90  Pulse: 72  Resp: 16  Temp: 97.9 F (36.6 C)  TempSrc: Temporal  SpO2: 96%  Weight: (!) 337 lb (152.9 kg)  Height: '6\' 3"'  (1.905 m)   Body mass index is 42.12 kg/m. Wt Readings from Last 3 Encounters:  09/19/20 (!) 337 lb (152.9 kg)  08/23/19 (!) 312 lb (141.5 kg)  08/20/18 298 lb (135.2 kg)    Physical Exam Vitals reviewed.  Constitutional:      Appearance: Normal appearance. He is obese.  HENT:     Head: Normocephalic.  Eyes:     Extraocular Movements: Extraocular movements intact.     Conjunctiva/sclera: Conjunctivae normal.     Pupils: Pupils are equal, round, and reactive to light.  Neck:     Vascular: No carotid bruit.  Cardiovascular:     Rate and Rhythm: Normal rate and regular rhythm.     Pulses: Normal pulses.     Heart sounds: Normal heart sounds. No murmur heard.   Pulmonary:     Effort: Pulmonary effort is normal.     Breath sounds: Normal breath sounds.  Abdominal:     General: Bowel sounds are normal. There is no distension.     Palpations: Abdomen is soft.     Tenderness: There is no abdominal tenderness.  Musculoskeletal:     Cervical back: Normal range of motion and neck supple. No tenderness.     Right lower leg: No  edema.     Left lower leg: No edema.  Lymphadenopathy:     Cervical: No cervical adenopathy.  Skin:    General: Skin is warm.     Capillary Refill: Capillary refill takes less than 2 seconds.  Neurological:     General: No focal deficit present.     Mental Status: He is alert and oriented to person, place, and time.  Psychiatric:        Mood and Affect: Mood normal.        Behavior: Behavior normal.      ASSESSMENT & PLAN: Atherosclerosis of native coronary artery of native  heart without angina pectoris Incidental finding on chest CT scan done  for lung cancer screening.  Three-vessel coronary atherosclerosis.  Clinically asymptomatic.  Patient's functional capacity at an acceptable level.  However he is morbidly obese with history of dyslipidemia.  Patient already taking rosuvastatin 20 mg daily and daily baby aspirin.  Elevated blood pressure.  We will start amlodipine 5 mg daily.  Needs cardiology evaluation.  Referral placed today.   Stancil was seen today for annual exam.  Diagnoses and all orders for this visit:  Encounter for general adult medical examination with abnormal findings  Atherosclerosis of native coronary artery of native heart without angina pectoris -     Ambulatory referral to Cardiology  Centrilobular emphysema ([place])  Hepatic steatosis  Essential hypertension -     CMP14+EGFR -     amLODipine (NORVASC) 5 MG tablet; Take 1 tablet (5 mg total) by mouth daily.  Morbid obesity (HCC) -     Hemoglobin A1c  Aortic atherosclerosis (HCC)  Dyslipidemia -     Lipid panel    Patient Instructions       If you have lab work done today you will be contacted with your lab results within the next 2 weeks.  If you have not heard from [place] then please contact us. The fastest way to get your results is to register for My Chart.   IF you received an x-ray today, you will receive an invoice from [facility]. Please contact [facility] at  [phone] with questions or concerns regarding your invoice.   IF you received labwork today, you will receive an invoice from [facility]. Please contact [facility] at [phone] with questions or concerns regarding your invoice.   Our billing staff will not be able to assist you with questions regarding bills from these companies.  You will be contacted with the lab results as soon as they are available. The fastest way to get your results is to activate your My Chart account. Instructions are located on the last page of this paperwork. If you have not heard from [place] regarding the results in 2 weeks, please contact this office.      Health Maintenance, Male Adopting a healthy lifestyle and getting preventive care are important in promoting health and wellness. Ask your health care provider about:  The right schedule for you to have regular tests and exams.  Things you can do on your own to prevent diseases and keep yourself healthy. What should I know about diet, weight, and exercise? Eat a healthy diet  Eat a diet that includes plenty of vegetables, fruits, low-fat dairy products, and lean protein.  Do not eat a lot of foods that are high in solid fats, added sugars, or sodium.   Maintain a healthy weight Body mass index (BMI) is a measurement that can be used to identify possible weight problems. It estimates body fat based on height and weight. Your health care provider can help determine your BMI and help you achieve or maintain a healthy weight. Get regular exercise Get regular exercise. This is one of the most important things you can do for your health. Most adults should:  Exercise for at least 150 minutes each week. The exercise should increase your heart rate and make you sweat (moderate-intensity exercise).  Do strengthening exercises at least twice a week. This is in addition to the moderate-intensity exercise.  Spend less time sitting. Even light physical activity can be  beneficial. Watch cholesterol and blood lipids Have  your blood tested for lipids and cholesterol at 59 years of age, then have this test every 5 years. You may need to have your cholesterol levels checked more often if:  Your lipid or cholesterol levels are high.  You are older than 59 years of age.  You are at high risk for heart disease. What should I know about cancer screening? Many types of cancers can be detected early and may often be prevented. Depending on your health history and family history, you may need to have cancer screening at various ages. This may include screening for:  Colorectal cancer.  Prostate cancer.  Skin cancer.  Lung cancer. What should I know about heart disease, diabetes, and high blood pressure? Blood pressure and heart disease  High blood pressure causes heart disease and increases the risk of stroke. This is more likely to develop in people who have high blood pressure readings, are of African descent, or are overweight.  Talk with your health care provider about your target blood pressure readings.  Have your blood pressure checked: ? Every 3-5 years if you are 57-75 years of age. ? Every year if you are 73 years old or older.  If you are between the ages of 48 and 25 and are a current or former smoker, ask your health care provider if you should have a one-time screening for abdominal aortic aneurysm (AAA). Diabetes Have regular diabetes screenings. This checks your fasting blood sugar level. Have the screening done:  Once every three years after age 97 if you are at a normal weight and have a low risk for diabetes.  More often and at a younger age if you are overweight or have a high risk for diabetes. What should I know about preventing infection? Hepatitis B If you have a higher risk for hepatitis B, you should be screened for this virus. Talk with your health care provider to find out if you are at risk for hepatitis B  infection. Hepatitis C Blood testing is recommended for:  Everyone born from [birth year] through 1965.  Anyone with known risk factors for hepatitis C. Sexually transmitted infections (STIs)  You should be screened each year for STIs, including gonorrhea and chlamydia, if: ? You are sexually active and are younger than 59 years of age. ? You are older than 59 years of age and your health care provider tells you that you are at risk for this type of infection. ? Your sexual activity has changed since you were last screened, and you are at increased risk for chlamydia or gonorrhea. Ask your health care provider if you are at risk.  Ask your health care provider about whether you are at high risk for HIV. Your health care provider may recommend a prescription medicine to help prevent HIV infection. If you choose to take medicine to prevent HIV, you should first get tested for HIV. You should then be tested every 3 months for as long as you are taking the medicine. Follow these instructions at home: Lifestyle  Do not use any products that contain nicotine or tobacco, such as cigarettes, e-cigarettes, and chewing tobacco. If you need help quitting, ask your health care provider.  Do not use street drugs.  Do not share needles.  Ask your health care provider for help if you need support or information about quitting drugs. Alcohol use  Do not drink alcohol if your health care provider tells you not to drink.  If you drink alcohol: ? Limit how  much you have to 0-2 drinks a day. ? Be aware of how much alcohol is in your drink. In the [place] one drink equals one 12 oz bottle of beer (355 mL), one 5 oz glass of wine (148 mL), or one 1 oz glass of hard liquor (44 mL). General instructions  Schedule regular health, dental, and eye exams.  Stay current with your vaccines.  Tell your health care provider if: ? You often feel depressed. ? You have ever been abused or do not feel safe at  home. Summary  Adopting a healthy lifestyle and getting preventive care are important in promoting health and wellness.  Follow your health care provider's instructions about healthy diet, exercising, and getting tested or screened for diseases.  Follow your health care provider's instructions on monitoring your cholesterol and blood pressure. This information is not intended to replace advice given to you by your health care provider. Make sure you discuss any questions you have with your health care provider. Document Revised: 06/17/2018 Document Reviewed: 06/17/2018 Elsevier Patient Education  2021 Elsevier Inc.      Agustina Caroli, MD [facility]

## 2020-09-19 NOTE — Patient Instructions (Addendum)
   If you have lab work done today you will be contacted with your lab results within the next 2 weeks.  If you have not heard from us then please contact us. The fastest way to get your results is to register for My Chart.   IF you received an x-ray today, you will receive an invoice from [facility]. Please contact [facility] at [phone] with questions or concerns regarding your invoice.   IF you received labwork today, you will receive an invoice from [facility]. Please contact [facility] at [phone] with questions or concerns regarding your invoice.   Our billing staff will not be able to assist you with questions regarding bills from these companies.  You will be contacted with the lab results as soon as they are available. The fastest way to get your results is to activate your My Chart account. Instructions are located on the last page of this paperwork. If you have not heard from us regarding the results in 2 weeks, please contact this office.      Health Maintenance, Male Adopting a healthy lifestyle and getting preventive care are important in promoting health and wellness. Ask your health care provider about:  The right schedule for you to have regular tests and exams.  Things you can do on your own to prevent diseases and keep yourself healthy. What should I know about diet, weight, and exercise? Eat a healthy diet  Eat a diet that includes plenty of vegetables, fruits, low-fat dairy products, and lean protein.  Do not eat a lot of foods that are high in solid fats, added sugars, or sodium.   Maintain a healthy weight Body mass index (BMI) is a measurement that can be used to identify possible weight problems. It estimates body fat based on height and weight. Your health care provider can help determine your BMI and help you achieve or maintain a healthy weight. Get regular exercise Get regular exercise. This is one of the most important things you  can do for your health. Most adults should:  Exercise for at least 150 minutes each week. The exercise should increase your heart rate and make you sweat (moderate-intensity exercise).  Do strengthening exercises at least twice a week. This is in addition to the moderate-intensity exercise.  Spend less time sitting. Even light physical activity can be beneficial. Watch cholesterol and blood lipids Have your blood tested for lipids and cholesterol at 59 years of age, then have this test every 5 years. You may need to have your cholesterol levels checked more often if:  Your lipid or cholesterol levels are high.  You are older than 59 years of age.  You are at high risk for heart disease. What should I know about cancer screening? Many types of cancers can be detected early and may often be prevented. Depending on your health history and family history, you may need to have cancer screening at various ages. This may include screening for:  Colorectal cancer.  Prostate cancer.  Skin cancer.  Lung cancer. What should I know about heart disease, diabetes, and high blood pressure? Blood pressure and heart disease  High blood pressure causes heart disease and increases the risk of stroke. This is more likely to develop in people who have high blood pressure readings, are of African descent, or are overweight.  Talk with your health care provider about your target blood pressure readings.  Have your blood pressure checked: ? Every 3-5 years if you are   18-39 years of age. ? Every year if you are 40 years old or older.  If you are between the ages of 65 and 75 and are a current or former smoker, ask your health care provider if you should have a one-time screening for abdominal aortic aneurysm (AAA). Diabetes Have regular diabetes screenings. This checks your fasting blood sugar level. Have the screening done:  Once every three years after age 45 if you are at a normal weight and have  a low risk for diabetes.  More often and at a younger age if you are overweight or have a high risk for diabetes. What should I know about preventing infection? Hepatitis B If you have a higher risk for hepatitis B, you should be screened for this virus. Talk with your health care provider to find out if you are at risk for hepatitis B infection. Hepatitis C Blood testing is recommended for:  Everyone born from 1945 through 1965.  Anyone with known risk factors for hepatitis C. Sexually transmitted infections (STIs)  You should be screened each year for STIs, including gonorrhea and chlamydia, if: ? You are sexually active and are younger than 59 years of age. ? You are older than 59 years of age and your health care provider tells you that you are at risk for this type of infection. ? Your sexual activity has changed since you were last screened, and you are at increased risk for chlamydia or gonorrhea. Ask your health care provider if you are at risk.  Ask your health care provider about whether you are at high risk for HIV. Your health care provider may recommend a prescription medicine to help prevent HIV infection. If you choose to take medicine to prevent HIV, you should first get tested for HIV. You should then be tested every 3 months for as long as you are taking the medicine. Follow these instructions at home: Lifestyle  Do not use any products that contain nicotine or tobacco, such as cigarettes, e-cigarettes, and chewing tobacco. If you need help quitting, ask your health care provider.  Do not use street drugs.  Do not share needles.  Ask your health care provider for help if you need support or information about quitting drugs. Alcohol use  Do not drink alcohol if your health care provider tells you not to drink.  If you drink alcohol: ? Limit how much you have to 0-2 drinks a day. ? Be aware of how much alcohol is in your drink. In the [place] one drink equals one 12  oz bottle of beer (355 mL), one 5 oz glass of wine (148 mL), or one 1 oz glass of hard liquor (44 mL). General instructions  Schedule regular health, dental, and eye exams.  Stay current with your vaccines.  Tell your health care provider if: ? You often feel depressed. ? You have ever been abused or do not feel safe at home. Summary  Adopting a healthy lifestyle and getting preventive care are important in promoting health and wellness.  Follow your health care provider's instructions about healthy diet, exercising, and getting tested or screened for diseases.  Follow your health care provider's instructions on monitoring your cholesterol and blood pressure. This information is not intended to replace advice given to you by your health care provider. Make sure you discuss any questions you have with your health care provider. Document Revised: 06/17/2018 Document Reviewed: 06/17/2018 Elsevier Patient Education  2021 Elsevier Inc.  

## 2020-09-20 ENCOUNTER — Other Ambulatory Visit: Payer: Self-pay | Admitting: Emergency Medicine

## 2020-09-20 DIAGNOSIS — E1165 Type 2 diabetes mellitus with hyperglycemia: Secondary | ICD-10-CM

## 2020-09-20 MED ORDER — METFORMIN HCL 1000 MG PO TABS: 1000.0000 mg | ORAL_TABLET | Freq: Two times a day (BID) | ORAL | 3 refills | Status: DC

## 2020-09-21 ENCOUNTER — Other Ambulatory Visit: Payer: Self-pay | Admitting: Emergency Medicine

## 2020-09-21 NOTE — Telephone Encounter (Signed)
Requested Prescriptions  Pending Prescriptions Disp Refills  . tadalafil (CIALIS) 5 MG tablet [Pharmacy Med Name: TADALAFIL 5 MG TABLET] 5 tablet 11    Sig: TAKE 1 TABLET BY MOUTH DAILY AS NEEDED FOR ERECTILE DYSFUNCTION     Urology: Erectile Dysfunction Agents Failed - 09/21/2020  1:56 AM      Failed - Last BP in normal range    BP Readings from Last 1 Encounters:  09/19/20 (!) 150/90         Passed - Valid encounter within last 12 months    Recent Outpatient Visits          2 days ago Encounter for general adult medical examination with abnormal findings   [facility] [place], Ines Bloomer, MD   1 year ago Routine general medical examination at a health care facility   [place] [facility] [place], Ines Bloomer, MD   2 years ago Routine general medical examination at a health care facility   [place] [facility] [place], Ines Bloomer, MD   3 years ago Routine general medical examination at a health care facility   [place] [facility], Ines Bloomer, MD      Future Appointments            In 6 months [place], Ines Bloomer, MD [place] [facility]

## 2020-10-13 ENCOUNTER — Ambulatory Visit: Payer: BC Managed Care – PPO | Admitting: Cardiology

## 2020-10-16 ENCOUNTER — Other Ambulatory Visit: Payer: Self-pay | Admitting: *Deleted

## 2020-10-16 DIAGNOSIS — Z87891 Personal history of nicotine dependence: Secondary | ICD-10-CM

## 2020-10-27 ENCOUNTER — Encounter: Payer: Self-pay | Admitting: Emergency Medicine

## 2020-10-30 NOTE — Telephone Encounter (Signed)
Appt with [facility] on 11/20/20; next OV with PCP 03/22/21.

## 2020-10-30 NOTE — Telephone Encounter (Signed)
Thanks

## 2020-10-30 NOTE — Telephone Encounter (Signed)
Jose Bennett was seen by [place] on 09/19/2020 and referred to cardiology.  Please look into the status of this referral as it is very important that he is evaluated by them.  We can address the diabetes and weight loss issue on our next appointment after cardiology evaluation.  [name] might be a good option for him, one I will consider.  Thanks.

## 2020-11-09 ENCOUNTER — Other Ambulatory Visit: Payer: Self-pay | Admitting: Emergency Medicine

## 2020-11-09 ENCOUNTER — Encounter: Payer: Self-pay | Admitting: Emergency Medicine

## 2020-11-09 DIAGNOSIS — E785 Hyperlipidemia, unspecified: Secondary | ICD-10-CM

## 2020-11-09 NOTE — Telephone Encounter (Signed)
Most likely swelling of ankles and feet have a pulmonary source and is related to emphysema but needs cardiac evaluation.  We referred him to cardiology.  Please make sure he was scheduled for cardiology evaluation.  Thanks.

## 2020-11-13 NOTE — Telephone Encounter (Signed)
Please read my response to daughter Leonard Downing dated 11/10/2020.  Thanks.

## 2020-11-13 NOTE — Telephone Encounter (Signed)
Called pt to confirm that PCP is able to answer questions from daughter Leonard Downing on [place].  Pt gives verbal consent to speak about his health via mychart.  Pt advised to update DPR to include daughter at next OV; pt verb understanding.  Pt also advised that, per PCP, his swelling of ankles & feet have a pulmonary source & is r/t to emphysema but needs cardia eval which he will have on May 16.  Pt verb understanding.

## 2020-11-13 NOTE — Telephone Encounter (Signed)
I already replied to this message on 11/10/2020.  Please refer to my answer and message.  Thanks.

## 2020-11-14 NOTE — Telephone Encounter (Signed)
No problem. Thanks 

## 2020-11-17 NOTE — Progress Notes (Signed)
Cardiology Office Note   Date:  11/20/2020   ID:  Jose Bennett, DOB [birth year], MRN [id number]  PCP:  Jose Pollen, MD  Cardiologist:   Jose Luberto Martinique, MD   Chief Complaint  Patient presents with  . Coronary Artery Disease  . Leg Swelling      History of Present Illness: Jose Bennett is a 60 y.o. male who is seen at the request of Dr Jose Bennett for evaluation of CAD and LE edema. He has a history of HLD, obesity,  and HTN. CT of chest for lung screening in March 2021 showed 3 vessel coronary calcification. He is a former smoker.  Patient has gained 40 lbs in the past year and is now diabetic. He is sedentary. He notes he tires easily with yard work and does note occasional chest heaviness but has had this for some time. His ankles swell - more at night. His wife reports he snores loudly and has apneic spells. Had sleep study 3 years ago in [place] and told he has OSA but reports he couldn't use CPAP. He was prescribed metformin but hasn't started this yet. Concerned that other family members got ill on this medication. In addition to calcification his CT showed emphysema and fatty liver.     Past Medical History:  Diagnosis Date  . Hyperlipidemia     Past Surgical History:  Procedure Laterality Date  . BRAIN SURGERY    . KNEE ARTHROSCOPY     x2     Current Outpatient Medications  Medication Sig Dispense Refill  . amLODipine (NORVASC) 5 MG tablet Take 1 tablet (5 mg total) by mouth daily. 90 tablet 3  . Ascorbic Acid (VITAMIN C) 1000 MG tablet Take 1,000 mg by mouth daily.    Marland Kitchen aspirin EC 81 MG tablet Take 81 mg by mouth daily.    Marland Kitchen CALCIUM PO Take by mouth daily.    . Cholecalciferol (VITAMIN D PO) Take by mouth once a week.    Marland Kitchen lisinopril (ZESTRIL) 10 MG tablet Take 1 tablet (10 mg total) by mouth daily. 90 tablet 3  . Magnesium 250 MG TABS Take by mouth daily.    . metFORMIN (GLUCOPHAGE) 1000 MG tablet Take 1 tablet (1,000 mg total) by mouth 2 (two)  times daily with a meal. 180 tablet 3  . rosuvastatin (CRESTOR) 40 MG tablet Take 1 tablet (40 mg total) by mouth daily. 90 tablet 3  . tadalafil (CIALIS) 5 MG tablet TAKE 1 TABLET BY MOUTH DAILY AS NEEDED FOR ERECTILE DYSFUNCTION 5 tablet 11  . Zinc 50 MG CAPS Take by mouth.     No current facility-administered medications for this visit.    Allergies:   Patient has no known allergies.    Social History:  The patient  reports that he quit smoking about 13 years ago. He has a 68.00 pack-year smoking history. He has never used smokeless tobacco. He reports current alcohol use of about 7.0 standard drinks of alcohol per week. He reports that he does not use drugs.   Family History:  The patient's family history includes CAD in his mother; Cancer in his father; Diabetes in his mother; Heart attack (age of onset: 79) in his father and paternal grandfather; Heart attack (age of onset: 75) in his maternal grandfather; Prostate cancer in his father; Thyroid disease in his sister.    ROS:  Please see the history of present illness.   Otherwise, review of systems are positive for  none.   All other systems are reviewed and negative.    PHYSICAL EXAM: VS:  BP (!) 152/84   Pulse 62   Ht 6\' 3"  (1.905 m)   Wt (!) 336 lb 9.6 oz (152.7 kg)   BMI 42.07 kg/m  , BMI Body mass index is 42.07 kg/m. GEN: Well nourished, morbidly obese, in no acute distress  HEENT: normal  Neck: no JVD, carotid bruits, or masses Cardiac: RRR; no murmurs, rubs, or gallops, 1+ pitting edema  Respiratory:  clear to auscultation bilaterally, normal work of breathing GI: soft, nontender, nondistended, + BS MS: no deformity or atrophy  Skin: warm and dry, no rash Neuro:  Strength and sensation are intact Psych: euthymic mood, full affect   EKG:  EKG is ordered today. The ekg ordered today demonstrates NSR with Normal Ecg. I have personally reviewed and interpreted this study.    Recent Labs: 09/19/2020: ALT 57; BUN  16; Creatinine, Ser 0.86; Potassium 5.0; Sodium 138    Lipid Panel    Component Value Date/Time   CHOL 224 (H) 09/19/2020 0902   TRIG 171 (H) 09/19/2020 0902   HDL 53 09/19/2020 0902   CHOLHDL 4.2 09/19/2020 0902   CHOLHDL 6 10/02/2009 0836   VLDL 47.0 (H) 10/02/2009 0836   LDLCALC 140 (H) 09/19/2020 0902   LDLDIRECT 188.0 10/02/2009 0836      Wt Readings from Last 3 Encounters:  11/20/20 (!) 336 lb 9.6 oz (152.7 kg)  09/19/20 (!) 337 lb (152.9 kg)  08/23/19 (!) 312 lb (141.5 kg)      Other studies Reviewed: Additional studies/ records that were reviewed today include: as noted in HPI   ASSESSMENT AND PLAN:  1.  CAD with Coronary calcification noted on CT. Patient does tire easily and has intermittent chest heaviness c/w stable angina. Will arrange for a stress Myoview to further assess risk. If abnormal he will need cardiac cath. If OK will need aggressive risk factor modification.  2. Aortic atherosclerosis 3. Obesity 40 lb weight gain this year. Stressed need for structured weight loss program. Carb modified diet.  4. Hyperlipidemia. Goal LDL < 70. Will increase Crestor to 40 mg daily. If still not at goal will need to consider adding Zetia or Repatha.  5. HTN- poorly controlled. Will add lisinopril 10 mg daily to amlodipine. 6. DM type 2. A1c 6.9%. needs to follow up with primary care given concerns about metformin. 7. Remote history of tobacco abuse. Some emphysema noted on CT 8. OSA currently untreated. Will refer to sleep specialist.  9. LE edema. May be related to amlodipine but need to assess cardiac function. Will schedule Echo.   Current medicines are reviewed at length with the patient today.  The patient does not have concerns regarding medicines.  The following changes have been made:  Increase Crestor to 40 mg daily. Add lisinopril 10 mg daily  Labs/ tests ordered today include:   Orders Placed This Encounter  Procedures  . EKG 12-Lead      Disposition:   FU with me after above studies. Signed, Jose Mcelvain Martinique, MD  11/20/2020 9:20 AM    [facility] [address], [place], [place], [address] Phone [phone], Fax [phone]

## 2020-11-20 ENCOUNTER — Encounter: Payer: Self-pay | Admitting: Cardiology

## 2020-11-20 ENCOUNTER — Ambulatory Visit (INDEPENDENT_AMBULATORY_CARE_PROVIDER_SITE_OTHER): Payer: BC Managed Care – PPO | Admitting: Cardiology

## 2020-11-20 ENCOUNTER — Other Ambulatory Visit: Payer: Self-pay

## 2020-11-20 VITALS — BP 152/84 | HR 62 | Ht 75.0 in | Wt 336.6 lb

## 2020-11-20 DIAGNOSIS — I25118 Atherosclerotic heart disease of native coronary artery with other forms of angina pectoris: Secondary | ICD-10-CM | POA: Diagnosis not present

## 2020-11-20 DIAGNOSIS — I10 Essential (primary) hypertension: Secondary | ICD-10-CM | POA: Diagnosis not present

## 2020-11-20 DIAGNOSIS — E782 Mixed hyperlipidemia: Secondary | ICD-10-CM | POA: Diagnosis not present

## 2020-11-20 DIAGNOSIS — I700 Atherosclerosis of aorta: Secondary | ICD-10-CM | POA: Diagnosis not present

## 2020-11-20 DIAGNOSIS — G4733 Obstructive sleep apnea (adult) (pediatric): Secondary | ICD-10-CM

## 2020-11-20 MED ORDER — ROSUVASTATIN CALCIUM 40 MG PO TABS
40.0000 mg | ORAL_TABLET | Freq: Every day | ORAL | 3 refills | Status: DC
Start: 2020-11-20 — End: 2022-04-08

## 2020-11-20 MED ORDER — LISINOPRIL 10 MG PO TABS
10.0000 mg | ORAL_TABLET | Freq: Every day | ORAL | 3 refills | Status: DC
Start: 2020-11-20 — End: 2021-05-16

## 2020-11-20 NOTE — Addendum Note (Signed)
Addended by: Kathyrn Lass on: 11/20/2020 10:06 AM   Modules accepted: Orders

## 2020-11-20 NOTE — Patient Instructions (Addendum)
Increase Crestor to 40 mg daily  Start lisinopril 10 mg daily for blood pressure  We will refer you to a sleep specialist to see about treating your sleep apnea  You need to increase your aerobic exercise and get serious about weight loss with low carbohydrate and low sodium diet.   We will schedule you for a Nuclear stress test and Echocardiogram.  Schedule follow up appointment after test

## 2020-11-20 NOTE — Addendum Note (Signed)
Addended by: Kathyrn Lass on: 11/20/2020 10:01 AM   Modules accepted: Orders

## 2020-11-21 ENCOUNTER — Telehealth (HOSPITAL_COMMUNITY): Payer: Self-pay | Admitting: *Deleted

## 2020-11-21 NOTE — Telephone Encounter (Signed)
Close encounter 

## 2020-11-23 ENCOUNTER — Other Ambulatory Visit: Payer: Self-pay

## 2020-11-23 ENCOUNTER — Ambulatory Visit (HOSPITAL_COMMUNITY)
Admission: RE | Admit: 2020-11-23 | Discharge: 2020-11-23 | Disposition: A | Discharge status: Home / Self Care | Payer: BC Managed Care – PPO | Source: Ambulatory Visit | Attending: Cardiology | Admitting: Cardiology

## 2020-11-23 DIAGNOSIS — I25118 Atherosclerotic heart disease of native coronary artery with other forms of angina pectoris: Secondary | ICD-10-CM | POA: Insufficient documentation

## 2020-11-23 DIAGNOSIS — I10 Essential (primary) hypertension: Secondary | ICD-10-CM | POA: Insufficient documentation

## 2020-11-23 DIAGNOSIS — E782 Mixed hyperlipidemia: Secondary | ICD-10-CM | POA: Insufficient documentation

## 2020-11-23 DIAGNOSIS — I700 Atherosclerosis of aorta: Secondary | ICD-10-CM | POA: Insufficient documentation

## 2020-11-23 DIAGNOSIS — G4733 Obstructive sleep apnea (adult) (pediatric): Secondary | ICD-10-CM | POA: Diagnosis present

## 2020-11-23 MED ORDER — REGADENOSON 0.4 MG/5ML IV SOLN
0.4000 mg | Freq: Once | INTRAVENOUS | Status: AC
Start: 2020-11-23 — End: 2020-11-23
  Administered 2020-11-23: 0.4 mg via INTRAVENOUS

## 2020-11-23 MED ORDER — TECHNETIUM TC 99M TETROFOSMIN IV KIT
28.4000 | PACK | Freq: Once | INTRAVENOUS | Status: AC | PRN
  Administered 2020-11-23: 28.4 via INTRAVENOUS
  Filled 2020-11-23: qty 29

## 2020-11-24 ENCOUNTER — Ambulatory Visit (HOSPITAL_COMMUNITY)
Admission: RE | Admit: 2020-11-24 | Discharge: 2020-11-24 | Disposition: A | Discharge status: Home / Self Care | Payer: BC Managed Care – PPO | Source: Ambulatory Visit | Attending: Cardiology | Admitting: Cardiology

## 2020-11-24 LAB — MYOCARDIAL PERFUSION IMAGING
LV dias vol: 181 mL (ref 62–150)
LV sys vol: 90 mL
Peak HR: 91 {beats}/min
Rest HR: 71 {beats}/min
SDS: 4
SRS: 1
SSS: 5
TID: 1.14

## 2020-11-24 MED ORDER — TECHNETIUM TC 99M TETROFOSMIN IV KIT
31.8000 | PACK | Freq: Once | INTRAVENOUS | Status: AC | PRN
  Administered 2020-11-24: 31.8 via INTRAVENOUS

## 2020-12-01 ENCOUNTER — Telehealth: Payer: Self-pay | Admitting: *Deleted

## 2020-12-11 NOTE — Progress Notes (Signed)
Cardiology Office Note   Date:  12/25/2020   ID:  Jose Bennett, DOB September 25, 1960, MRN [id number]  PCP:  Horald Pollen, MD  Cardiologist:   Mardie Kellen Martinique, MD   Chief Complaint  Patient presents with   Coronary Artery Disease   Hypertension      History of Present Illness: Jose Bennett is a 60 y.o. male who is seen at the request of Dr Mitchel Honour for evaluation of CAD and LE edema. He has a history of HLD, obesity,  and HTN. CT of chest for lung screening in March 2021 showed 3 vessel coronary calcification. He is a former smoker.  Patient has gained 40 lbs in the past year and is now diabetic. He is sedentary. He notes he tires easily with yard work and does note occasional chest heaviness but has had this for some time. His ankles swell - more at night. His wife reports he snores loudly and has apneic spells. Had sleep study 3 years ago in [name] and told he has OSA but reports he couldn't use CPAP. He was prescribed metformin but hasn't started this yet. Concerned that other family members got ill on this medication. In addition to calcification his CT showed emphysema and fatty liver.   Since our last visit he notes he has started walking more and lost about 10 lbs but gained some of this back last week. He still has some swelling in his ankles late in the day. He is back using his CPAP every night and tolerating this well. Myoview and Echo results noted below.     Past Medical History:  Diagnosis Date   Hyperlipidemia     Past Surgical History:  Procedure Laterality Date   BRAIN SURGERY     KNEE ARTHROSCOPY     x2     Current Outpatient Medications  Medication Sig Dispense Refill   amLODipine (NORVASC) 5 MG tablet Take 1 tablet (5 mg total) by mouth daily. 90 tablet 3   Ascorbic Acid (VITAMIN C) 1000 MG tablet Take 1,000 mg by mouth daily.     aspirin EC 81 MG tablet Take 81 mg by mouth daily.     CALCIUM PO Take by mouth daily.     Cholecalciferol  (VITAMIN D PO) Take by mouth once a week.     lisinopril (ZESTRIL) 10 MG tablet Take 1 tablet (10 mg total) by mouth daily. 90 tablet 3   Magnesium 250 MG TABS Take by mouth daily.     rosuvastatin (CRESTOR) 40 MG tablet Take 1 tablet (40 mg total) by mouth daily. 90 tablet 3   tadalafil (CIALIS) 5 MG tablet TAKE 1 TABLET BY MOUTH DAILY AS NEEDED FOR ERECTILE DYSFUNCTION 5 tablet 11   Zinc 50 MG CAPS Take by mouth.     No current facility-administered medications for this visit.    Allergies:   Patient has no known allergies.    Social History:  The patient  reports that he quit smoking about 13 years ago. His smoking use included cigarettes. He has a 68.00 pack-year smoking history. He has never used smokeless tobacco. He reports current alcohol use of about 7.0 standard drinks of alcohol per week. He reports that he does not use drugs.   Family History:  The patient's family history includes CAD in his mother; Cancer in his father; Diabetes in his mother; Heart attack (age of onset: 73) in his father and paternal grandfather; Heart attack (age of onset: 34)  in his maternal grandfather; Prostate cancer in his father; Thyroid disease in his sister.    ROS:  Please see the history of present illness.   Otherwise, review of systems are positive for none.   All other systems are reviewed and negative.    PHYSICAL EXAM: VS:  BP 134/78 (BP Location: Left Arm, Patient Position: Sitting, Cuff Size: Large)   Pulse 68   Ht 6\' 3"  (1.905 m)   Wt (!) 331 lb 6.4 oz (150.3 kg)   SpO2 96%   BMI 41.42 kg/m  , BMI Body mass index is 41.42 kg/m. GEN: Well nourished, morbidly obese, in no acute distress  HEENT: normal  Neck: no JVD, carotid bruits, or masses Cardiac: RRR; no murmurs, rubs, or gallops, 1+ pitting edema  Respiratory:  clear to auscultation bilaterally, normal work of breathing GI: soft, nontender, nondistended, + BS MS: no deformity or atrophy  Skin: warm and dry, no rash Neuro:   Strength and sensation are intact Psych: euthymic mood, full affect   EKG:  EKG is not ordered today.   Recent Labs: 09/19/2020: ALT 57; BUN 16; Creatinine, Ser 0.86; Potassium 5.0; Sodium 138    Lipid Panel    Component Value Date/Time   CHOL 224 (H) 09/19/2020 0902   TRIG 171 (H) 09/19/2020 0902   HDL 53 09/19/2020 0902   CHOLHDL 4.2 09/19/2020 0902   CHOLHDL 6 10/02/2009 0836   VLDL 47.0 (H) 10/02/2009 0836   LDLCALC 140 (H) 09/19/2020 0902   LDLDIRECT 188.0 10/02/2009 0836      Wt Readings from Last 3 Encounters:  12/25/20 (!) 331 lb 6.4 oz (150.3 kg)  12/19/20 (!) 336 lb (152.4 kg)  11/23/20 (!) 336 lb (152.4 kg)      Other studies Reviewed: Additional studies/ records that were reviewed today include:   Myoview 11/24/20: Study Highlights    The left ventricular ejection fraction is mildly decreased (45-54%). Nuclear stress EF: 50%. No T wave inversion was noted during stress. There was no ST segment deviation noted during stress. This is a low risk study.   No reversible ischemia. TID ratio is not elevated. LVEF 50% with normal wall motion. This is a low risk study. No prior for comparison.   Echo 12/18/20: IMPRESSIONS     1. Left ventricular ejection fraction, by estimation, is 65 to 70%. The  left ventricle has normal function. The left ventricle has no regional  wall motion abnormalities. Left ventricular diastolic parameters were  normal.   2. Right ventricular systolic function is normal. The right ventricular  size is normal. There is normal pulmonary artery systolic pressure.   3. The mitral valve is normal in structure. No evidence of mitral valve  regurgitation.   4. The aortic valve is normal in structure. Aortic valve regurgitation is  not visualized.    ASSESSMENT AND PLAN:  1.  CAD with Coronary calcification noted on CT. Patient does tire easily. Stress Myoview showed no perfusion abnormality and EF 50%. Echo is normal. Based on these  results we need to focus on risk factor modification. He is to report any worsening chest pain or dyspnea.  2. Aortic atherosclerosis 3. Obesity 40 lb weight gain this year. Stressed need for structured weight loss program. Carb modified diet.  4. Hyperlipidemia. Goal LDL < 70. Last LDL 140. We increased Crestor to 40 mg daily. If still not at goal will need to consider adding Zetia or Repatha. Repeat fasting lab in 3 months. 5. HTN-  control improved with addition of lisinopril to amlodipine.  6. DM type 2. A1c 6.9%. needs to follow up with primary care given concerns about metformin. Consider other Rx options.  7. Remote history of tobacco abuse. Some emphysema noted on CT 8. OSA now back on CPAP therapy. Encourage continued use  9. LE edema. No evidence of CHF based on Echo. May be related to amlodipine. Stable. Limit salt intake.     Disposition:   FU with me 6 months Signed, Jarious Lyon Martinique, MD  12/25/2020 8:55 AM    [facility] [address] [place], [place], [address] Phone [phone], Fax [phone]

## 2020-12-12 NOTE — Telephone Encounter (Signed)
Error

## 2020-12-14 ENCOUNTER — Other Ambulatory Visit: Payer: Self-pay

## 2020-12-18 ENCOUNTER — Ambulatory Visit (HOSPITAL_COMMUNITY): Payer: BC Managed Care – PPO | Attending: Cardiology

## 2020-12-18 ENCOUNTER — Other Ambulatory Visit: Payer: Self-pay

## 2020-12-18 DIAGNOSIS — I10 Essential (primary) hypertension: Secondary | ICD-10-CM | POA: Insufficient documentation

## 2020-12-18 DIAGNOSIS — I700 Atherosclerosis of aorta: Secondary | ICD-10-CM | POA: Diagnosis not present

## 2020-12-18 DIAGNOSIS — E782 Mixed hyperlipidemia: Secondary | ICD-10-CM | POA: Insufficient documentation

## 2020-12-18 DIAGNOSIS — G4733 Obstructive sleep apnea (adult) (pediatric): Secondary | ICD-10-CM | POA: Insufficient documentation

## 2020-12-18 DIAGNOSIS — I25118 Atherosclerotic heart disease of native coronary artery with other forms of angina pectoris: Secondary | ICD-10-CM | POA: Diagnosis present

## 2020-12-18 LAB — ECHOCARDIOGRAM COMPLETE
AR max vel: 3.09 cm2
AV Area VTI: 3.01 cm2
AV Area mean vel: 3 cm2
AV Mean grad: 7 mmHg
AV Peak grad: 15.6 mmHg
Ao pk vel: 1.98 m/s
Area-P 1/2: 2.9 cm2
S' Lateral: 3.1 cm

## 2020-12-20 ENCOUNTER — Ambulatory Visit
Admission: RE | Admit: 2020-12-20 | Discharge: 2020-12-20 | Disposition: A | Discharge status: Home / Self Care | Payer: BC Managed Care – PPO | Source: Ambulatory Visit | Attending: Acute Care | Admitting: Acute Care

## 2020-12-20 DIAGNOSIS — Z87891 Personal history of nicotine dependence: Secondary | ICD-10-CM

## 2020-12-21 NOTE — Progress Notes (Signed)
Call patient please.  CT scan of chest unchanged.  No signs of lung cancer.  Still shows emphysema, aortic atherosclerosis, coronary artery calcifications, and fatty liver.  Thanks.

## 2020-12-25 ENCOUNTER — Encounter: Payer: Self-pay | Admitting: Cardiology

## 2020-12-25 ENCOUNTER — Other Ambulatory Visit: Payer: Self-pay

## 2020-12-25 ENCOUNTER — Ambulatory Visit (INDEPENDENT_AMBULATORY_CARE_PROVIDER_SITE_OTHER): Payer: BC Managed Care – PPO | Admitting: Cardiology

## 2020-12-25 VITALS — BP 134/78 | HR 68 | Ht 75.0 in | Wt 331.4 lb

## 2020-12-25 DIAGNOSIS — I25118 Atherosclerotic heart disease of native coronary artery with other forms of angina pectoris: Secondary | ICD-10-CM | POA: Diagnosis not present

## 2020-12-25 DIAGNOSIS — I700 Atherosclerosis of aorta: Secondary | ICD-10-CM | POA: Diagnosis not present

## 2020-12-25 DIAGNOSIS — G4733 Obstructive sleep apnea (adult) (pediatric): Secondary | ICD-10-CM

## 2020-12-25 DIAGNOSIS — I10 Essential (primary) hypertension: Secondary | ICD-10-CM | POA: Diagnosis not present

## 2020-12-25 DIAGNOSIS — E782 Mixed hyperlipidemia: Secondary | ICD-10-CM | POA: Diagnosis not present

## 2020-12-27 ENCOUNTER — Institutional Professional Consult (permissible substitution): Payer: BC Managed Care – PPO | Admitting: Pulmonary Disease

## 2021-01-02 ENCOUNTER — Other Ambulatory Visit: Payer: Self-pay | Admitting: *Deleted

## 2021-01-02 DIAGNOSIS — Z87891 Personal history of nicotine dependence: Secondary | ICD-10-CM

## 2021-01-02 NOTE — Progress Notes (Signed)
Looks like this was called by PCP. Please place order for annual screening 12/2021. Thanks

## 2021-02-01 ENCOUNTER — Other Ambulatory Visit: Payer: Self-pay | Admitting: Emergency Medicine

## 2021-02-01 DIAGNOSIS — E785 Hyperlipidemia, unspecified: Secondary | ICD-10-CM

## 2021-03-16 ENCOUNTER — Telehealth: Payer: Self-pay

## 2021-03-21 NOTE — Telephone Encounter (Signed)
PA key: [id number]  After filling out the prior auth, the outcome states " [employer] does not manage PA for this patient. Please contact the number on the back of the members card for further assistance".

## 2021-03-22 ENCOUNTER — Ambulatory Visit: Payer: BC Managed Care – PPO | Admitting: Emergency Medicine

## 2021-04-20 ENCOUNTER — Encounter: Payer: Self-pay | Admitting: Emergency Medicine

## 2021-04-20 ENCOUNTER — Telehealth: Payer: BC Managed Care – PPO | Admitting: Physician Assistant

## 2021-04-20 DIAGNOSIS — U071 COVID-19: Secondary | ICD-10-CM

## 2021-04-20 MED ORDER — BENZONATATE 100 MG PO CAPS: 100.0000 mg | ORAL_CAPSULE | Freq: Three times a day (TID) | ORAL | 0 refills | Status: DC | PRN

## 2021-04-20 MED ORDER — MOLNUPIRAVIR EUA 200MG CAPSULE: 4.0000 | ORAL_CAPSULE | Freq: Two times a day (BID) | ORAL | 0 refills | Status: AC

## 2021-04-20 NOTE — Patient Instructions (Signed)
Hello Jose "Merry Proud",  You are being placed in the home monitoring program for COVID-19 (commonly known as Coronavirus).  This is because you are suspected to have the virus or are known to have the virus.  If you are unsure which group you fall into call your clinic.    As part of this program, you'll answer a daily questionnaire in the MyChart mobile app. You'll receive a notification through the MyChart app when the questionnaire is available. When you log in to MyChart, you'll see the tasks in your To Do activity.       Clinicians will see any answers that are concerning and take appropriate steps.  If at any point you are having a medical emergency, call 911.  If otherwise concerned call your clinic instead of coming into the clinic or hospital.  To keep from spreading the disease you should: Stay home and limit contact with other people as much as possible.  Wash your hands frequently. Cover your coughs and sneezes with a tissue, and throw used tissues in the trash.   Clean and disinfect frequently touched surfaces and objects.    Take care of yourself by: Staying home Resting Drinking fluids Take fever-reducing medications (Tylenol/Acetaminophen and Ibuprofen)  For more information on the disease go to the Centers for Disease Control and Prevention website     You are being prescribed MOLNUPIRAVIR for COVID-19 infection.   Please call the pharmacy or go through the drive through vs going inside if you are picking up the mediation yourself to prevent further spread. If prescribed to a [facility] affiliated pharmacy, a pharmacist will bring the medication out to your car.   ADMINISTRATION INSTRUCTIONS: Take with or without food. Swallow the tablets whole. Don't chew, crush, or break the medications because it might not work as well  For each dose of the medication, you should be taking FOUR tablets at one time, TWICE a day   Finish your full five-day course of Molnupiravir  even if you feel better before you're done. Stopping this medication too early can make it less effective to prevent severe illness related to [place].    Molnupiravir is prescribed for YOU ONLY. Don't share it with others, even if they have similar symptoms as you. This medication might not be right for everyone.   Make sure to take steps to protect yourself and others while you're taking this medication in order to get well soon and to prevent others from getting sick with COVID-19.   **If you are of childbearing potential (any gender) - it is advised to not get pregnant while taking this medication and recommended that condoms are used for male partners the next 3 months after taking the medication out of extreme caution    COMMON SIDE EFFECTS: Diarrhea Nausea  Dizziness    If your COVID-19 symptoms get worse, get medical help right away. Call 911 if you experience symptoms such as worsening cough, trouble breathing, chest pain that doesn't go away, confusion, a hard time staying awake, and pale or blue-colored skin. This medication won't prevent all COVID-19 cases from getting worse.   Can take to lessen severity: Vit C 500mg  twice daily Quercertin 250-500mg  twice daily Zinc 75-100mg  daily Melatonin 3-6 mg at bedtime Vit D3 1000-2000 IU daily Aspirin 81 mg daily with food Optional: Famotidine 20mg  daily Also can add tylenol/ibuprofen as needed for fevers and body aches May add Mucinex or Mucinex DM as needed for cough/congestion  10 Things You Can  Do to Manage Your COVID-19 Symptoms at Home If you have possible or confirmed COVID-19 Stay home except to get medical care. Monitor your symptoms carefully. If your symptoms get worse, call your healthcare provider immediately. Get rest and stay hydrated. If you have a medical appointment, call the healthcare provider ahead of time and tell them that you have or may have COVID-19. For medical emergencies, call 911 and notify the  dispatch personnel that you have or may have COVID-19. Cover your cough and sneezes with a tissue or use the inside of your elbow. Wash your hands often with soap and water for at least 20 seconds or clean your hands with an alcohol-based hand sanitizer that contains at least 60% alcohol. As much as possible, stay in a specific room and away from other people in your home. Also, you should use a separate bathroom, if available. If you need to be around other people in or outside of the home, wear a mask. Avoid sharing personal items with other people in your household, like dishes, towels, and bedding. Clean all surfaces that are touched often, like counters, tabletops, and doorknobs. Use household cleaning sprays or wipes according to the label instructions. [email or web address] 01/21/2020 This information is not intended to replace advice given to you by your health care provider. Make sure you discuss any questions you have with your health care provider. Document Revised: 11/09/2020 Document Reviewed: 11/09/2020 Elsevier Patient Education  [place].

## 2021-04-20 NOTE — Progress Notes (Signed)
Based on the information that you have shared in the e-Visit Questionnaire, we recommend that you convert this visit to a video visit in order for the provider to better assess what is going on.  The provider will be able to give you a more accurate diagnosis and treatment plan if we can more freely discuss your symptoms and with the addition of a virtual examination.   If you desire the FDA-emergency use antiviral medication options, we do require a video visit for that. You are considered moderate to high risk for complications due to covid and would be a candidate for antiviral medications. To access a video visit please select the "Virtual Urgent Care Visit" option in the [employer].  If you convert to a video visit, we will bill your insurance (similar to an office visit) and you will not be charged for this e-Visit. You will be able to stay at home and speak with the first available [facility] advanced practice provider. The link to do a video visit is in the drop down Menu tab of your Welcome screen in [place].

## 2021-04-20 NOTE — Progress Notes (Signed)
Virtual Visit Consent   Jose Bennett, you are scheduled for a virtual visit with a [facility] provider today.     Just as with appointments in the office, your consent must be obtained to participate.  Your consent will be active for this visit and any virtual visit you may have with one of our providers in the next 365 days.     If you have a MyChart account, a copy of this consent can be sent to you electronically.  All virtual visits are billed to your insurance company just like a traditional visit in the office.    As this is a virtual visit, video technology does not allow for your provider to perform a traditional examination.  This may limit your provider's ability to fully assess your condition.  If your provider identifies any concerns that need to be evaluated in person or the need to arrange testing (such as labs, EKG, etc.), we will make arrangements to do so.     Although advances in technology are sophisticated, we cannot ensure that it will always work on either your end or our end.  If the connection with a video visit is poor, the visit may have to be switched to a telephone visit.  With either a video or telephone visit, we are not always able to ensure that we have a secure connection.     I need to obtain your verbal consent now.   Are you willing to proceed with your visit today?    Jose Bennett has provided verbal consent on 04/20/2021 for a virtual visit (video or telephone).   Mar Daring, PA-C   Date: 04/20/2021 5:39 PM   Virtual Visit via Video Note   I, Mar Daring, connected with  Jose Bennett  ([id number], Sep 02, 1960) on 04/20/21 at  5:30 PM EDT by a video-enabled telemedicine application and verified that I am speaking with the correct person using two identifiers.  Location: Patient: Virtual Visit Location Patient: Home Provider: Virtual Visit Location Provider: Home Office   I discussed the limitations of evaluation and  management by telemedicine and the availability of in person appointments. The patient expressed understanding and agreed to proceed.    History of Present Illness: Jose Bennett is a 60 y.o. who identifies as a male who was assigned male at birth, and is being seen today for Covid 1.  HPI: URI  This is a new problem. The current episode started yesterday (tested positive for covid 19 today; symptoms started last night). The problem has been gradually worsening. The maximum temperature recorded prior to his arrival was 101 - 101.9 F. The fever has been present for Less than 1 day. Associated symptoms include congestion, coughing and headaches. Associated symptoms comments: Myalgias, chills, hot flashes. He has tried acetaminophen, NSAIDs and sleep for the symptoms. The treatment provided no relief.     Problems:  Patient Active Problem List   Diagnosis Date Noted   Atherosclerosis of native coronary artery of native heart without angina pectoris 09/19/2020   Centrilobular emphysema ([place]) 09/19/2020   Hepatic steatosis 09/19/2020   Essential hypertension 09/19/2020   Morbid obesity ([place]) 09/19/2020   Aortic atherosclerosis ([place]) 09/19/2020   ABNORMAL INVOLUNTARY MOVEMENTS 09/26/2009   ADD 10/05/2007   Dyslipidemia 03/23/2007    Allergies: No Known Allergies Medications:  Current Outpatient Medications:    benzonatate (TESSALON) 100 MG capsule, Take 1 capsule (100 mg total) by mouth 3 (three) times daily as  needed., Disp: 30 capsule, Rfl: 0   molnupiravir EUA (LAGEVRIO) 200 mg CAPS capsule, Take 4 capsules (800 mg total) by mouth 2 (two) times daily for 5 days., Disp: 40 capsule, Rfl: 0   amLODipine (NORVASC) 5 MG tablet, Take 1 tablet (5 mg total) by mouth daily., Disp: 90 tablet, Rfl: 3   Ascorbic Acid (VITAMIN C) 1000 MG tablet, Take 1,000 mg by mouth daily., Disp: , Rfl:    aspirin EC 81 MG tablet, Take 81 mg by mouth daily., Disp: , Rfl:    CALCIUM PO, Take by mouth daily.,  Disp: , Rfl:    Cholecalciferol (VITAMIN D PO), Take by mouth once a week., Disp: , Rfl:    lisinopril (ZESTRIL) 10 MG tablet, Take 1 tablet (10 mg total) by mouth daily., Disp: 90 tablet, Rfl: 3   Magnesium 250 MG TABS, Take by mouth daily., Disp: , Rfl:    rosuvastatin (CRESTOR) 40 MG tablet, Take 1 tablet (40 mg total) by mouth daily., Disp: 90 tablet, Rfl: 3   tadalafil (CIALIS) 5 MG tablet, TAKE 1 TABLET BY MOUTH DAILY AS NEEDED FOR ERECTILE DYSFUNCTION, Disp: 5 tablet, Rfl: 11   Zinc 50 MG CAPS, Take by mouth., Disp: , Rfl:   Observations/Objective: Patient is well-developed, well-nourished in no acute distress.  Resting comfortably at home.  Head is normocephalic, atraumatic.  No labored breathing.  Speech is clear and coherent with logical content.  Patient is alert and oriented at baseline.    Assessment and Plan: 1. COVID-19 - MyChart COVID-19 home monitoring program; Future - molnupiravir EUA (LAGEVRIO) 200 mg CAPS capsule; Take 4 capsules (800 mg total) by mouth 2 (two) times daily for 5 days.  Dispense: 40 capsule; Refill: 0 - benzonatate (TESSALON) 100 MG capsule; Take 1 capsule (100 mg total) by mouth 3 (three) times daily as needed.  Dispense: 30 capsule; Refill: 0  - Continue OTC symptomatic management of choice - Will send OTC vitamins and supplement information through AVS - Molnupiravir prescribed - Tessalon for cough - Patient enrolled in MyChart symptom monitoring - Push fluids - Rest as needed - Discussed return precautions and when to seek in-person evaluation, sent via AVS as well  Follow Up Instructions: I discussed the assessment and treatment plan with the patient. The patient was provided an opportunity to ask questions and all were answered. The patient agreed with the plan and demonstrated an understanding of the instructions.  A copy of instructions were sent to the patient via MyChart unless otherwise noted below.    The patient was advised to call  back or seek an in-person evaluation if the symptoms worsen or if the condition fails to improve as anticipated.  Time:  I spent 14 minutes with the patient via telehealth technology discussing the above problems/concerns.    Mar Daring, PA-C

## 2021-05-12 ENCOUNTER — Other Ambulatory Visit: Payer: Self-pay | Admitting: Emergency Medicine

## 2021-05-12 DIAGNOSIS — E785 Hyperlipidemia, unspecified: Secondary | ICD-10-CM

## 2021-05-16 ENCOUNTER — Other Ambulatory Visit: Payer: Self-pay

## 2021-05-16 DIAGNOSIS — I10 Essential (primary) hypertension: Secondary | ICD-10-CM

## 2021-05-16 MED ORDER — LISINOPRIL 10 MG PO TABS: 10.0000 mg | ORAL_TABLET | Freq: Every day | ORAL | 0 refills | Status: DC

## 2021-05-16 MED ORDER — AMLODIPINE BESYLATE 5 MG PO TABS: 5.0000 mg | ORAL_TABLET | Freq: Every day | ORAL | 0 refills | Status: DC

## 2021-05-16 NOTE — Telephone Encounter (Signed)
Refill sent to pharmacy.   

## 2021-05-17 ENCOUNTER — Telehealth: Payer: Self-pay

## 2021-05-17 NOTE — Telephone Encounter (Signed)
Called the pt's pharmacy to get clarification on his refills. She states the Amlodipine is filled and ready to pick up but the Lisinopril is due for refill on 11/13 per his insurance. Will send the pt a MyChart message to give him the update.

## 2021-06-25 ENCOUNTER — Encounter: Payer: Self-pay | Admitting: Emergency Medicine

## 2021-06-27 ENCOUNTER — Telehealth: Payer: Self-pay | Admitting: Emergency Medicine

## 2021-06-27 ENCOUNTER — Other Ambulatory Visit: Payer: Self-pay | Admitting: Emergency Medicine

## 2021-06-27 ENCOUNTER — Encounter: Payer: Self-pay | Admitting: Emergency Medicine

## 2021-06-27 DIAGNOSIS — E1165 Type 2 diabetes mellitus with hyperglycemia: Secondary | ICD-10-CM

## 2021-06-27 MED ORDER — OZEMPIC (0.25 OR 0.5 MG/DOSE) 2 MG/1.5ML ~~LOC~~ SOPN: 0.5000 mg | PEN_INJECTOR | SUBCUTANEOUS | 5 refills | Status: DC

## 2021-06-27 NOTE — Telephone Encounter (Signed)
Rep w/ rx benefits requesting all clinical notes that support the reason ozempic medication is needed for patient  Rep requesting notes uploaded to portal or faxed  Portal: [email or web address]  Fax: [phone]  Member Id: [id number]

## 2021-06-27 NOTE — Telephone Encounter (Signed)
New prescription for Ozempic sent to pharmacy of record.  Thanks.

## 2021-06-28 MED ORDER — OZEMPIC (0.25 OR 0.5 MG/DOSE) 2 MG/1.5ML ~~LOC~~ SOPN: 0.5000 mg | PEN_INJECTOR | SUBCUTANEOUS | 5 refills | Status: DC

## 2021-06-28 NOTE — Telephone Encounter (Signed)
Pt states that the [facility] pharmacy did not have Ozempic in stock, pt request it be sent to [facility] on [address].

## 2021-06-28 NOTE — Telephone Encounter (Signed)
Faxed clinical notes to contact information provided.

## 2021-07-05 ENCOUNTER — Ambulatory Visit: Payer: BC Managed Care – PPO | Admitting: Physician Assistant

## 2021-07-10 ENCOUNTER — Encounter: Payer: Self-pay | Admitting: Emergency Medicine

## 2021-07-10 ENCOUNTER — Other Ambulatory Visit: Payer: Self-pay

## 2021-07-10 ENCOUNTER — Ambulatory Visit: Payer: BC Managed Care – PPO | Admitting: Emergency Medicine

## 2021-07-10 VITALS — BP 136/82 | HR 68 | Ht 75.0 in | Wt 336.0 lb

## 2021-07-10 DIAGNOSIS — I2510 Atherosclerotic heart disease of native coronary artery without angina pectoris: Secondary | ICD-10-CM

## 2021-07-10 DIAGNOSIS — I10 Essential (primary) hypertension: Secondary | ICD-10-CM

## 2021-07-10 DIAGNOSIS — I700 Atherosclerosis of aorta: Secondary | ICD-10-CM | POA: Diagnosis not present

## 2021-07-10 DIAGNOSIS — K760 Fatty (change of) liver, not elsewhere classified: Secondary | ICD-10-CM

## 2021-07-10 DIAGNOSIS — J432 Centrilobular emphysema: Secondary | ICD-10-CM | POA: Diagnosis not present

## 2021-07-10 DIAGNOSIS — E1165 Type 2 diabetes mellitus with hyperglycemia: Secondary | ICD-10-CM

## 2021-07-10 DIAGNOSIS — E785 Hyperlipidemia, unspecified: Secondary | ICD-10-CM

## 2021-07-10 DIAGNOSIS — R0609 Other forms of dyspnea: Secondary | ICD-10-CM

## 2021-07-10 LAB — CBC WITH DIFFERENTIAL/PLATELET
Basophils Absolute: 0 10*3/uL (ref 0.0–0.1)
Basophils Relative: 0.9 % (ref 0.0–3.0)
Eosinophils Absolute: 0.2 10*3/uL (ref 0.0–0.7)
Eosinophils Relative: 4.8 % (ref 0.0–5.0)
HCT: 53.9 % — ABNORMAL HIGH (ref 39.0–52.0)
Hemoglobin: 17.7 g/dL — ABNORMAL HIGH (ref 13.0–17.0)
Lymphocytes Relative: 36.5 % (ref 12.0–46.0)
Lymphs Abs: 1.9 10*3/uL (ref 0.7–4.0)
MCHC: 32.9 g/dL (ref 30.0–36.0)
MCV: 92.8 fl (ref 78.0–100.0)
Monocytes Absolute: 0.5 10*3/uL (ref 0.1–1.0)
Monocytes Relative: 10.2 % (ref 3.0–12.0)
Neutro Abs: 2.5 10*3/uL (ref 1.4–7.7)
Neutrophils Relative %: 47.6 % (ref 43.0–77.0)
Platelets: 267 10*3/uL (ref 150.0–400.0)
RBC: 5.81 Mil/uL (ref 4.22–5.81)
RDW: 14.5 % (ref 11.5–15.5)
WBC: 5.2 10*3/uL (ref 4.0–10.5)

## 2021-07-10 LAB — LIPID PANEL
Cholesterol: 246 mg/dL — ABNORMAL HIGH (ref 0–200)
HDL: 42.1 mg/dL (ref >39.00)
LDL Cholesterol: 177 mg/dL — ABNORMAL HIGH (ref 0–99)
NonHDL: 204.33
Total CHOL/HDL Ratio: 6
Triglycerides: 138 mg/dL (ref 0.0–149.0)
VLDL: 27.6 mg/dL (ref 0.0–40.0)

## 2021-07-10 LAB — COMPREHENSIVE METABOLIC PANEL
ALT: 47 U/L (ref 0–53)
AST: 34 U/L (ref 0–37)
Albumin: 4.5 g/dL (ref 3.5–5.2)
Alkaline Phosphatase: 45 U/L (ref 39–117)
BUN: 14 mg/dL (ref 6–23)
CO2: 33 mEq/L — ABNORMAL HIGH (ref 19–32)
Calcium: 10 mg/dL (ref 8.4–10.5)
Chloride: 100 mEq/L (ref 96–112)
Creatinine, Ser: 0.99 mg/dL (ref 0.40–1.50)
GFR: 83.18 mL/min (ref >60.00)
Glucose, Bld: 104 mg/dL — ABNORMAL HIGH (ref 70–99)
Potassium: 4.7 mEq/L (ref 3.5–5.1)
Sodium: 140 mEq/L (ref 135–145)
Total Bilirubin: 0.9 mg/dL (ref 0.2–1.2)
Total Protein: 7.5 g/dL (ref 6.0–8.3)

## 2021-07-10 LAB — POCT GLYCOSYLATED HEMOGLOBIN (HGB A1C): Hemoglobin A1C: 6 % — AB (ref 4.0–5.6)

## 2021-07-10 MED ORDER — BREZTRI AEROSPHERE 160-9-4.8 MCG/ACT IN AERO: 2.0000 | INHALATION_SPRAY | Freq: Two times a day (BID) | RESPIRATORY_TRACT | 11 refills | Status: DC

## 2021-07-10 NOTE — Assessment & Plan Note (Signed)
Hemoglobin A1c at 6.0 today.  Intolerant to metformin. Will start Ozempic 0.5 mg weekly to help with weight loss and provide cardiovascular protection as well.

## 2021-07-10 NOTE — Assessment & Plan Note (Signed)
Well-controlled hypertension.  Continue lisinopril 10 mg and amlodipine 5 mg daily. BP Readings from Last 3 Encounters:  07/10/21 136/82  12/25/20 134/78  11/20/20 (!) 152/84

## 2021-07-10 NOTE — Assessment & Plan Note (Signed)
Not losing weight.  Diet and nutrition discussed.  We will start Ozempic 0.5 mg weekly.  Samples given.

## 2021-07-10 NOTE — Assessment & Plan Note (Signed)
Stable.  Continue Crestor 40 mg daily. 

## 2021-07-10 NOTE — Patient Instructions (Signed)

## 2021-07-10 NOTE — Assessment & Plan Note (Signed)
Stable.  Has some dyspnea on exertion.  May benefit from daily inhaler LABA/LAMA.

## 2021-07-10 NOTE — Assessment & Plan Note (Signed)
Continue rosuvastatin 40mg daily  

## 2021-07-10 NOTE — Assessment & Plan Note (Signed)
Diet and nutrition discussed.  Lipid profile done today.  Continue Crestor 40 mg daily.

## 2021-07-10 NOTE — Progress Notes (Signed)
Jose Bennett 61 y.o.   Chief Complaint  Patient presents with   Hypertension    F/u   Diabetes    F/u would like sample of ozempic    HISTORY OF PRESENT ILLNESS: This is a 61 y.o. male here for follow-up of diabetes, hypertension, dyslipidemia. Doing well.  Requesting sample of Ozempic. Recent cardiac work-up unremarkable.  Cardiology office visit notes reviewed. ASSESSMENT AND PLAN:   1.  CAD with Coronary calcification noted on CT. Patient does tire easily. Stress Myoview showed no perfusion abnormality and EF 50%. Echo is normal. Based on these results we need to focus on risk factor modification. He is to report any worsening chest pain or dyspnea.  2. Aortic atherosclerosis 3. Obesity 40 lb weight gain this year. Stressed need for structured weight loss program. Carb modified diet.  4. Hyperlipidemia. Goal LDL < 70. Last LDL 140. We increased Crestor to 40 mg daily. If still not at goal will need to consider adding Zetia or Repatha. Repeat fasting lab in 3 months. 5. HTN- control improved with addition of lisinopril to amlodipine.  6. DM type 2. A1c 6.9%. needs to follow up with primary care given concerns about metformin. Consider other Rx options.  7. Remote history of tobacco abuse. Some emphysema noted on CT 8. OSA now back on CPAP therapy. Encourage continued use  9. LE edema. No evidence of CHF based on Echo. May be related to amlodipine. Stable. Limit salt intake.        Disposition:   FU with me 6 months Signed, Peter Martinique, MD  12/25/2020 8:55 AM    [facility] [address], [place], [place], [address] Phone [phone], Fax [phone]    Hypertension Pertinent negatives include no chest pain, headaches, palpitations or shortness of breath.  Diabetes Pertinent negatives for hypoglycemia include no dizziness or headaches. Pertinent negatives for diabetes include no chest pain.    Prior to Admission medications   Medication Sig  Start Date End Date Taking? Authorizing Provider  amLODipine (NORVASC) 5 MG tablet Take 1 tablet (5 mg total) by mouth daily. 05/16/21  Yes Martinique, [name], MD  Ascorbic Acid (VITAMIN C) 1000 MG tablet Take 1,000 mg by mouth daily.   Yes [provider]  aspirin EC 81 MG tablet Take 81 mg by mouth daily.   Yes [provider]  CALCIUM PO Take by mouth daily.   Yes [provider]  Cholecalciferol (VITAMIN D PO) Take by mouth once a week.   Yes [provider]  lisinopril (ZESTRIL) 10 MG tablet Take 1 tablet (10 mg total) by mouth daily. 05/16/21 05/11/22 Yes Martinique, [name], MD  Magnesium 250 MG TABS Take by mouth daily.   Yes [provider]  rosuvastatin (CRESTOR) 40 MG tablet Take 1 tablet (40 mg total) by mouth daily. 11/20/20 11/15/21 Yes Martinique, [name], MD  Semaglutide,0.25 or 0.5MG /DOS, (OZEMPIC, 0.25 OR 0.5 MG/DOSE,) 2 MG/1.5ML SOPN Inject 0.5 mg into the skin once a week. 06/28/21  Yes Basil Blakesley, Ines Bloomer, MD  tadalafil (CIALIS) 5 MG tablet TAKE 1 TABLET BY MOUTH DAILY AS NEEDED FOR ERECTILE DYSFUNCTION 09/21/20  Yes Horald Pollen, MD  Zinc 50 MG CAPS Take by mouth.   Yes [provider]    No Known Allergies  Patient Active Problem List   Diagnosis Date Noted   Atherosclerosis of native coronary artery of native heart without angina pectoris 09/19/2020   Centrilobular emphysema ([place]) 09/19/2020  Hepatic steatosis 09/19/2020   Essential hypertension 09/19/2020   Morbid obesity ([place]) 09/19/2020   Aortic atherosclerosis ([place]) 09/19/2020   ABNORMAL INVOLUNTARY MOVEMENTS 09/26/2009   ADD 10/05/2007   Dyslipidemia 03/23/2007    Past Medical History:  Diagnosis Date   Hyperlipidemia     Past Surgical History:  Procedure Laterality Date   BRAIN SURGERY     KNEE ARTHROSCOPY     x2    Social History   Socioeconomic History   Marital status: Married    Spouse name: Not on file   Number of children: 2   Years of  education: Not on file   Highest education level: Not on file  Occupational History   Occupation: sales- equipment  Tobacco Use   Smoking status: Former    Packs/day: 2.00    Years: 34.00    Pack years: 68.00    Types: Cigarettes    Quit date: 07/23/2007    Years since quitting: 13.9   Smokeless tobacco: Never  Substance and Sexual Activity   Alcohol use: Yes    Alcohol/week: 7.0 standard drinks    Types: 7 Glasses of wine per week   Drug use: No   Sexual activity: Not on file  Other Topics Concern   Not on file  Social History Narrative   Not on file   Social Determinants of Health   Financial Resource Strain: Not on file  Food Insecurity: Not on file  Transportation Needs: Not on file  Physical Activity: Not on file  Stress: Not on file  Social Connections: Not on file  Intimate Partner Violence: Not on file    Family History  Problem Relation Age of Onset   Prostate cancer Father    Heart attack Father 31       smoker   Cancer Father        LUNG   Diabetes Mother    CAD Mother    Thyroid disease Sister    Heart attack Maternal Grandfather 32   Heart attack Paternal Grandfather 21     Review of Systems  Constitutional: Negative.  Negative for chills and fever.  HENT: Negative.  Negative for congestion and sore throat.   Respiratory: Negative.  Negative for cough and shortness of breath.   Cardiovascular: Negative.  Negative for chest pain and palpitations.  Gastrointestinal:  Negative for abdominal pain, nausea and vomiting.  Genitourinary: Negative.  Negative for dysuria and hematuria.  Skin: Negative.  Negative for rash.  Neurological: Negative.  Negative for dizziness and headaches.  All other systems reviewed and are negative.  Wt Readings from Last 3 Encounters:  07/10/21 (!) 336 lb (152.4 kg)  12/25/20 (!) 331 lb 6.4 oz (150.3 kg)  12/19/20 (!) 336 lb (152.4 kg)   BP Readings from Last 3 Encounters:  07/10/21 136/82  12/25/20 134/78   11/20/20 (!) 152/84    Physical Exam Vitals reviewed.  Constitutional:      Appearance: Normal appearance. He is obese.  HENT:     Head: Normocephalic.  Eyes:     Extraocular Movements: Extraocular movements intact.     Conjunctiva/sclera: Conjunctivae normal.     Pupils: Pupils are equal, round, and reactive to light.  Cardiovascular:     Rate and Rhythm: Normal rate and regular rhythm.     Pulses: Normal pulses.     Heart sounds: Normal heart sounds.  Pulmonary:     Effort: Pulmonary effort is normal.     Breath sounds: Normal breath  sounds.  Abdominal:     Palpations: Abdomen is soft.     Tenderness: There is no abdominal tenderness.  Skin:    General: Skin is warm and dry.     Capillary Refill: Capillary refill takes less than 2 seconds.  Neurological:     General: No focal deficit present.     Mental Status: He is alert and oriented to person, place, and time.  Psychiatric:        Mood and Affect: Mood normal.        Behavior: Behavior normal.    Results for orders placed or performed in visit on 07/10/21 (from the past 24 hour(s))  POCT glycosylated hemoglobin (Hb A1C)     Status: Abnormal   Collection Time: 07/10/21  9:50 AM  Result Value Ref Range   Hemoglobin A1C 6.0 (A) 4.0 - 5.6 %   HbA1c POC (<> result, manual entry)     HbA1c, POC (prediabetic range)     HbA1c, POC (controlled diabetic range)      ASSESSMENT & PLAN: A total of 47 minutes was spent with the patient and counseling/coordination of care regarding preparing for this visit, review of most recent office visit notes, review of most recent cardiologist office visit notes, review of all medications and changes made including Ozempic and its proper use, starting inhaler for dyspnea on exertion/COPD, education and nutrition, cardiovascular risks associated with diabetes, hypertension and dyslipidemia, prognosis, documentation and need for follow-up.  Problem List Items Addressed This Visit        Cardiovascular and Mediastinum   Atherosclerosis of native coronary artery of native heart without angina pectoris    Stable.  Continue Crestor 40 mg daily.      Essential hypertension    Well-controlled hypertension.  Continue lisinopril 10 mg and amlodipine 5 mg daily. BP Readings from Last 3 Encounters:  07/10/21 136/82  12/25/20 134/78  11/20/20 (!) 152/84         Relevant Orders   Comprehensive metabolic panel   CBC with Differential/Platelet   Aortic atherosclerosis (HCC)    Continue rosuvastatin 40 mg daily.      Relevant Orders   Lipid panel     Respiratory   Centrilobular emphysema (HCC)    Stable.  Has some dyspnea on exertion.  May benefit from daily inhaler LABA/LAMA.      Relevant Medications   Budeson-Glycopyrrol-Formoterol (BREZTRI AEROSPHERE) 160-9-4.8 MCG/ACT AERO     Digestive   Hepatic steatosis     Endocrine   Type 2 diabetes mellitus with hyperglycemia, without long-term current use of insulin (HCC) - Primary    Hemoglobin A1c at 6.0 today.  Intolerant to metformin. Will start Ozempic 0.5 mg weekly to help with weight loss and provide cardiovascular protection as well.      Relevant Orders   POCT glycosylated hemoglobin (Hb A1C) (Completed)   CBC with Differential/Platelet     Other   Dyslipidemia    Diet and nutrition discussed.  Lipid profile done today.  Continue Crestor 40 mg daily.      Relevant Orders   Lipid panel   Morbid obesity ([place])    Not losing weight.  Diet and nutrition discussed.  We will start Ozempic 0.5 mg weekly.  Samples given.      Other Visit Diagnoses     Dyspnea on exertion       Relevant Medications   Budeson-Glycopyrrol-Formoterol (BREZTRI AEROSPHERE) 160-9-4.8 MCG/ACT AERO      Patient Instructions  Diabetes  [place] and Nutrition, Adult When you have diabetes, or diabetes mellitus, it is very important to have healthy eating habits because your blood sugar (glucose) levels are greatly affected by  what you eat and drink. Eating healthy foods in the right amounts, at about the same times every day, can help you: Manage your blood glucose. Lower your risk of heart disease. Improve your blood pressure. Reach or maintain a healthy weight. What can affect my meal plan? Every person with diabetes is different, and each person has different needs for a meal plan. Your health care provider may recommend that you work with a dietitian to make a meal plan that is best for you. Your meal plan may vary depending on factors such as: The calories you need. The medicines you take. Your weight. Your blood glucose, blood pressure, and cholesterol levels. Your activity level. Other health conditions you have, such as heart or kidney disease. How do carbohydrates affect me? Carbohydrates, also called carbs, affect your blood glucose level more than any other type of food. Eating carbs raises the amount of glucose in your blood. It is important to know how many carbs you can safely have in each meal. This is different for every person. Your dietitian can help you calculate how many carbs you should have at each meal and for each snack. How does alcohol affect me? Alcohol can cause a decrease in blood glucose (hypoglycemia), especially if you use insulin or take certain diabetes medicines by mouth. Hypoglycemia can be a life-threatening condition. Symptoms of hypoglycemia, such as sleepiness, dizziness, and confusion, are similar to symptoms of having too much alcohol. Do not drink alcohol if: Your health care provider tells you not to drink. You are pregnant, may be pregnant, or are planning to become pregnant. If you drink alcohol: Limit how much you have to: 0-1 drink a day for women. 0-2 drinks a day for men. Know how much alcohol is in your drink. In the [place] one drink equals one 12 oz bottle of beer (355 mL), one 5 oz glass of wine (148 mL), or one 1 oz glass of hard liquor (44 mL). Keep yourself  hydrated with water, diet soda, or unsweetened iced tea. Keep in mind that regular soda, juice, and other mixers may contain a lot of sugar and must be counted as carbs. What are tips for following this plan? Reading food labels Start by checking the serving size on the Nutrition Facts label of packaged foods and drinks. The number of calories and the amount of carbs, fats, and other nutrients listed on the label are based on one serving of the item. Many items contain more than one serving per package. Check the total grams (g) of carbs in one serving. Check the number of grams of saturated fats and trans fats in one serving. Choose foods that have a low amount or none of these fats. Check the number of milligrams (mg) of salt (sodium) in one serving. Most people should limit total sodium intake to less than 2,300 mg per day. Always check the nutrition information of foods labeled as "low-fat" or "nonfat." These foods may be higher in added sugar or refined carbs and should be avoided. Talk to your dietitian to identify your daily goals for nutrients listed on the label. Shopping Avoid buying canned, pre-made, or processed foods. These foods tend to be high in fat, sodium, and added sugar. Shop around the outside edge of the grocery store. This is where you will  most often find fresh fruits and vegetables, bulk grains, fresh meats, and fresh dairy products. Cooking Use low-heat cooking methods, such as baking, instead of high-heat cooking methods, such as deep frying. Cook using healthy oils, such as olive, canola, or sunflower oil. Avoid cooking with butter, cream, or high-fat meats. Meal planning Eat meals and snacks regularly, preferably at the same times every day. Avoid going long periods of time without eating. Eat foods that are high in fiber, such as fresh fruits, vegetables, beans, and whole grains. Eat 4-6 oz (112-168 g) of lean protein each day, such as lean meat, chicken, fish, eggs,  or tofu. One ounce (oz) (28 g) of lean protein is equal to: 1 oz (28 g) of meat, chicken, or fish. 1 egg.  cup (62 g) of tofu. Eat some foods each day that contain healthy fats, such as avocado, nuts, seeds, and fish. What foods should I eat? Fruits Berries. Apples. Oranges. Peaches. Apricots. Plums. Grapes. Mangoes. Papayas. Pomegranates. Kiwi. Cherries. Vegetables Leafy greens, including lettuce, spinach, kale, chard, collard greens, mustard greens, and cabbage. Beets. Cauliflower. Broccoli. Carrots. Green beans. Tomatoes. Peppers. Onions. Cucumbers. Brussels sprouts. Grains Whole grains, such as whole-wheat or whole-grain bread, crackers, tortillas, cereal, and pasta. Unsweetened oatmeal. Quinoa. Brown or wild rice. Meats and other proteins Seafood. Poultry without skin. Lean cuts of poultry and beef. Tofu. Nuts. Seeds. Dairy Low-fat or fat-free dairy products such as milk, yogurt, and cheese. The items listed above may not be a complete list of foods and beverages you can eat and drink. Contact a dietitian for more information. What foods should I avoid? Fruits Fruits canned with syrup. Vegetables Canned vegetables. Frozen vegetables with butter or cream sauce. Grains Refined white flour and flour products such as bread, pasta, snack foods, and cereals. Avoid all processed foods. Meats and other proteins Fatty cuts of meat. Poultry with skin. Breaded or fried meats. Processed meat. Avoid saturated fats. Dairy Full-fat yogurt, cheese, or milk. Beverages Sweetened drinks, such as soda or iced tea. The items listed above may not be a complete list of foods and beverages you should avoid. Contact a dietitian for more information. Questions to ask a health care provider Do I need to meet with a certified diabetes care and education specialist? Do I need to meet with a dietitian? What number can I call if I have questions? When are the best times to check my blood glucose? Where to  find more information: American Diabetes Association: diabetes.org Academy of Nutrition and Dietetics: eatright.[employer] of Diabetes and Digestive and Kidney Diseases: [employer] Association of Diabetes Care & Education Specialists: diabeteseducator.org Summary It is important to have healthy eating habits because your blood sugar (glucose) levels are greatly affected by what you eat and drink. It is important to use alcohol carefully. A healthy meal plan will help you manage your blood glucose and lower your risk of heart disease. Your health care provider may recommend that you work with a dietitian to make a meal plan that is best for you. This information is not intended to replace advice given to you by your health care provider. Make sure you discuss any questions you have with your health care provider. Document Revised: 01/26/2020 Document Reviewed: 01/26/2020 Elsevier Patient Education  2022 [place], MD [facility]

## 2021-07-11 NOTE — Telephone Encounter (Signed)
Patient calling in  Patient says she has changed insurance for 2023 & the PA for ozempic now needs to be submitted to [employer]  Please advised patient when this has been done

## 2021-07-12 NOTE — Telephone Encounter (Signed)
Patient requesting PA resubmitted to [facility] including the reason why he is unable to take metformin due to digestive tract  Patient requesting Budeson-Glycopyrrol-Formoterol (BREZTRI AEROSPHERE) 160-9-4.8 MCG/ACT AERO   Semaglutide,0.25 or 0.5MG /DOS, (OZEMPIC, 0.25 OR 0.5 MG/DOSE,) 2 MG/1.5ML SOPN   sent to :[facility] [place], [address] [place]

## 2021-07-12 NOTE — Telephone Encounter (Signed)
Patient calling in  Patient wants to make sure once PA is completed rx is sent to correct pharmacy  Pharmacy  [id number] - [place], [place]. [address] Phone:  [phone]  Fax:  [phone]

## 2021-07-23 NOTE — Progress Notes (Signed)
Cardiology Office Note:    Date:  07/24/2021   ID:  Jose Bennett, DOB 1960-10-28, MRN [id number]  PCP:  Horald Pollen, [place] Cardiologist: Peter Martinique, MD   Reason for visit: 60-month follow-up  History of Present Illness:    Jose Bennett is a 61 y.o. male with a hx of CAD (three-vessel coronary calcification on CT of the chest), hyperlipidemia, hypertension, obesity, former smoker, emphysema, sleep apnea, fatty liver disease.  He saw Dr. Martinique in 2022.  Patient stated he gained 40 pounds the past year and was diagnosed with diabetes.  He noted occasional chest heaviness.  Echo showed no ischemia.  2D echo showed normal EF and no valvular disease.  Today, he is doing well.  He denies chest pain, shortness of breath, syncope, orthopnea, PND or significant pedal edema.  He recently started Ozempic and is hoping for weight loss.  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  His wife does walk daily and encourages him to join her.  He tries to make healthy choices and avoid sugars.    We discussed cardiac risk factors.  His LDL has increased, now at 177.  He complains about weakness and muscle aches on Crestor.  We discussed referral to Pharm.D. for PCSK9 inhibitor.  If his LDL improves on this plus weight loss, our plan can be to wean down statin dosage to mitigate statin side effects.  He states he is inconsistent with his CPAP use.  His blood pressure is typically better than today's with recent readings 136/82 and 134/78.  He states his A1c improved after starting testosterone injections, last A1c 6.0%.    Past Medical History:  Diagnosis Date   Hyperlipidemia     Past Surgical History:  Procedure Laterality Date   BRAIN SURGERY     KNEE ARTHROSCOPY     x2    Current Medications: Current Meds  Medication Sig   amLODipine (NORVASC) 5 MG tablet Take 1 tablet (5 mg total) by mouth daily.    Ascorbic Acid (VITAMIN C) 1000 MG tablet Take 1,000 mg by mouth daily.   aspirin EC 81 MG tablet Take 81 mg by mouth daily.   Budeson-Glycopyrrol-Formoterol (BREZTRI AEROSPHERE) 160-9-4.8 MCG/ACT AERO Inhale 2 puffs into the lungs 2 (two) times daily.   CALCIUM PO Take by mouth daily.   Cholecalciferol (VITAMIN D PO) Take by mouth once a week.   lisinopril (ZESTRIL) 10 MG tablet Take 1 tablet (10 mg total) by mouth daily.   Magnesium 250 MG TABS Take by mouth daily.   rosuvastatin (CRESTOR) 40 MG tablet Take 1 tablet (40 mg total) by mouth daily.   Semaglutide,0.25 or 0.5MG /DOS, (OZEMPIC, 0.25 OR 0.5 MG/DOSE,) 2 MG/1.5ML SOPN Inject 0.5 mg into the skin once a week.   tadalafil (CIALIS) 5 MG tablet TAKE 1 TABLET BY MOUTH DAILY AS NEEDED FOR ERECTILE DYSFUNCTION     Allergies:   Patient has no known allergies.   Social History   Socioeconomic History   Marital status: Married    Spouse name: Not on file   Number of children: 2   Years of education: Not on file   Highest education level: Not on file  Occupational History   Occupation: sales- equipment  Tobacco Use   Smoking status: Former    Packs/day: 2.00    Years: 34.00    Pack years: 68.00  Types: Cigarettes    Quit date: 07/23/2007    Years since quitting: 14.0   Smokeless tobacco: Never  Substance and Sexual Activity   Alcohol use: Yes    Alcohol/week: 7.0 standard drinks    Types: 7 Glasses of wine per week   Drug use: No   Sexual activity: Not on file  Other Topics Concern   Not on file  Social History Narrative   Not on file   Social Determinants of Health   Financial Resource Strain: Not on file  Food Insecurity: Not on file  Transportation Needs: Not on file  Physical Activity: Not on file  Stress: Not on file  Social Connections: Not on file     Family History: The patient's family history includes CAD in his mother; Cancer in his father; Diabetes in his mother; Heart attack (age of onset: 10) in his  father and paternal grandfather; Heart attack (age of onset: 64) in his maternal grandfather; Prostate cancer in his father; Thyroid disease in his sister.  ROS:   Please see the history of present illness.     EKGs/Labs/Other Studies Reviewed:    EKG:  The ekg ordered today demonstrates normal sinus rhythm, heart rate 71, PR interval 134 ms, QRS duration 110 ms.  Recent Labs: 07/10/2021: ALT 47; BUN 14; Creatinine, Ser 0.99; Hemoglobin 17.7; Platelets 267.0; Potassium 4.7; Sodium 140   Recent Lipid Panel Lab Results  Component Value Date/Time   CHOL 246 (H) 07/10/2021 10:24 AM   CHOL 224 (H) 09/19/2020 09:02 AM   TRIG 138.0 07/10/2021 10:24 AM   HDL 42.10 07/10/2021 10:24 AM   HDL 53 09/19/2020 09:02 AM   LDLCALC 177 (H) 07/10/2021 10:24 AM   LDLCALC 140 (H) 09/19/2020 09:02 AM   LDLDIRECT 188.0 10/02/2009 08:36 AM    Physical Exam:    VS:  BP (!) 150/82    Pulse 71    Ht 6\' 3"  (1.905 m)    Wt (!) 335 lb 3.2 oz (152 kg)    SpO2 90%    BMI 41.90 kg/m    No data found.  Wt Readings from Last 3 Encounters:  07/24/21 (!) 335 lb 3.2 oz (152 kg)  07/10/21 (!) 336 lb (152.4 kg)  12/25/20 (!) 331 lb 6.4 oz (150.3 kg)     GEN:  Well nourished, well developed in no acute distress, obese HEENT: Normal NECK: No JVD; No carotid bruits CARDIAC: RRR, no murmurs, rubs, gallops RESPIRATORY:  Clear to auscultation without rales, wheezing or rhonchi  ABDOMEN: Soft, non-tender, non-distended MUSCULOSKELETAL: No edema; No deformity  SKIN: Warm and dry NEUROLOGIC:  Alert and oriented PSYCHIATRIC:  Normal affect     ASSESSMENT AND PLAN   CAD, stable with no angina -Coronary calcifications on CT; stress Myoview in 2022 with no ischemia, 2D echo 2022 with normal EF -Continue statin and aspirin therapy.  Referred to Pharm.D. clinic for tighter LDL control.  Hypertension, BP elevated today -Blood pressure better at previous visits.  Recommend he take lisinopril in the morning and  amlodipine at night.  At this point, we can continue current medications.  I am hopeful with more consistent CPAP use, increased activity and weight loss, his blood pressure will improve. -Goal BP is <130/80.  Recommend DASH diet (high in vegetables, fruits, low-fat dairy products, whole grains, poultry, fish, and nuts and low in sweets, sugar-sweetened beverages, and red meats), salt restriction and increase physical activity.  Hyperlipidemia -LDL 177 in 07/2021.  Referred to our  Pharm.D. clinic for initiation of PCSK9 inhibitors.  He is familiar with injections as he is on Ozempic and testosterone. -Discussed cholesterol lowering diets - Mediterranean diet, DASH diet, vegetarian diet, low-carbohydrate diet and avoidance of trans fats.  Discussed healthier choice substitutes.  Nuts, high-fiber foods, and fiber supplements may also improve lipids.    Obesity -Discussed how even a 5-10% weight loss can have cardiovascular benefits.   -Recommend moderate intensity activity for 30 minutes 5 days/week and the DASH diet. -Referred to the [facility] prep program.  Sleep apnea -Recommend CPAP use and weight loss.  Diabetes -Well-controlled with A1c 6.0%.  On Ozempic.  Disposition - Follow-up in 6 months with myself or Dr. Martinique.        Medication Adjustments/Labs and Tests Ordered: Current medicines are reviewed at length with the patient today.  Concerns regarding medicines are outlined above.  Orders Placed This Encounter  Procedures   AMB Referral to [facility] Pharm-D   Amb Referral To Provider Referral Exercise Program (P.R.E.P)   EKG 12-Lead   No orders of the defined types were placed in this encounter.   Patient Instructions  Medication Instructions:   Lisinopril ( Take In The Morning ). Amlodipine (Take In The Evening). *If you need a refill on your cardiac medications before your next appointment, please call your pharmacy*   Lab Work: No Labs If you have labs (blood work)  drawn today and your tests are completely normal, you will receive your results only by: [place] (if you have MyChart) OR A paper copy in the mail If you have any lab test that is abnormal or we need to change your treatment, we will call you to review the results.   Testing/Procedures: No Testing   Follow-Up: At [facility], you and your health needs are our priority.  As part of our continuing mission to provide you with exceptional heart care, we have created designated Provider Care Teams.  These Care Teams include your primary Cardiologist (physician) and Advanced Practice Providers (APPs -  Physician Assistants and Nurse Practitioners) who all work together to provide you with the care you need, when you need it.  We recommend signing up for the patient portal called "MyChart".  Sign up information is provided on this After Visit Summary.  MyChart is used to connect with patients for Virtual Visits (Telemedicine).  Patients are able to view lab/test results, encounter notes, upcoming appointments, etc.  Non-urgent messages can be sent to your provider as well.   To learn more about what you can do with MyChart, go to [email or web address].    Your next appointment:   6 month(s)  The format for your next appointment:   In Person  Provider:   Peter Martinique, MD         Signed, Warren Lacy, PA-C  07/24/2021 9:51 AM    [place]

## 2021-07-24 ENCOUNTER — Other Ambulatory Visit: Payer: Self-pay

## 2021-07-24 ENCOUNTER — Encounter: Payer: Self-pay | Admitting: Physician Assistant

## 2021-07-24 ENCOUNTER — Ambulatory Visit: Payer: BC Managed Care – PPO | Admitting: Physician Assistant

## 2021-07-24 VITALS — BP 150/82 | HR 71 | Ht 75.0 in | Wt 335.2 lb

## 2021-07-24 DIAGNOSIS — I25118 Atherosclerotic heart disease of native coronary artery with other forms of angina pectoris: Secondary | ICD-10-CM

## 2021-07-24 DIAGNOSIS — E782 Mixed hyperlipidemia: Secondary | ICD-10-CM

## 2021-07-24 DIAGNOSIS — I10 Essential (primary) hypertension: Secondary | ICD-10-CM

## 2021-07-24 NOTE — Patient Instructions (Addendum)
Medication Instructions:   Lisinopril ( Take In The Morning ). Amlodipine (Take In The Evening). *If you need a refill on your cardiac medications before your next appointment, please call your pharmacy*   Lab Work: No Labs If you have labs (blood work) drawn today and your tests are completely normal, you will receive your results only by: [place] (if you have MyChart) OR A paper copy in the mail If you have any lab test that is abnormal or we need to change your treatment, we will call you to review the results.   Testing/Procedures: No Testing   Follow-Up: At [facility], you and your health needs are our priority.  As part of our continuing mission to provide you with exceptional heart care, we have created designated Provider Care Teams.  These Care Teams include your primary Cardiologist (physician) and Advanced Practice Providers (APPs -  Physician Assistants and Nurse Practitioners) who all work together to provide you with the care you need, when you need it.  We recommend signing up for the patient portal called "MyChart".  Sign up information is provided on this After Visit Summary.  MyChart is used to connect with patients for Virtual Visits (Telemedicine).  Patients are able to view lab/test results, encounter notes, upcoming appointments, etc.  Non-urgent messages can be sent to your provider as well.   To learn more about what you can do with MyChart, go to [email or web address].    Your next appointment:   6 month(s)  The format for your next appointment:   In Person  Provider:   Peter Martinique, MD

## 2021-07-27 ENCOUNTER — Telehealth: Payer: Self-pay

## 2021-07-27 NOTE — Telephone Encounter (Unsigned)
Called to discuss PREP program, he will hold off on participating for now, he plans to work out at [employer] and feels he's making good progress there.

## 2021-08-05 ENCOUNTER — Other Ambulatory Visit: Payer: Self-pay | Admitting: Emergency Medicine

## 2021-08-05 MED ORDER — OZEMPIC (1 MG/DOSE) 4 MG/3ML ~~LOC~~ SOPN: 1.0000 mg | PEN_INJECTOR | SUBCUTANEOUS | 5 refills | Status: DC

## 2021-08-05 NOTE — Telephone Encounter (Signed)
Next dose is 1 mg milligram weekly.  New prescription sent to pharmacy of record.  He also has to work on his diet.  Medication alone is not going to do it.  Thanks.

## 2021-08-12 ENCOUNTER — Encounter: Payer: Self-pay | Admitting: Emergency Medicine

## 2021-08-12 DIAGNOSIS — I10 Essential (primary) hypertension: Secondary | ICD-10-CM

## 2021-08-13 ENCOUNTER — Other Ambulatory Visit: Payer: Self-pay

## 2021-08-13 DIAGNOSIS — I10 Essential (primary) hypertension: Secondary | ICD-10-CM

## 2021-08-14 MED ORDER — AMLODIPINE BESYLATE 5 MG PO TABS: 5.0000 mg | ORAL_TABLET | Freq: Every day | ORAL | 0 refills | Status: DC

## 2021-08-14 MED ORDER — LISINOPRIL 10 MG PO TABS: 10.0000 mg | ORAL_TABLET | Freq: Every day | ORAL | 0 refills | Status: DC

## 2021-08-14 NOTE — Telephone Encounter (Signed)
Refilled Amlodipine, and Lisinopril. Sent to patient pharmacy.

## 2021-08-24 ENCOUNTER — Telehealth: Payer: Self-pay | Admitting: Pharmacist

## 2021-08-24 ENCOUNTER — Ambulatory Visit: Payer: BC Managed Care – PPO | Admitting: Pharmacist

## 2021-08-24 ENCOUNTER — Other Ambulatory Visit: Payer: Self-pay

## 2021-08-24 DIAGNOSIS — I2510 Atherosclerotic heart disease of native coronary artery without angina pectoris: Secondary | ICD-10-CM | POA: Diagnosis not present

## 2021-08-24 DIAGNOSIS — E785 Hyperlipidemia, unspecified: Secondary | ICD-10-CM

## 2021-08-24 NOTE — Telephone Encounter (Signed)
Please complete prior authorization for:  Name of medication, dose, and frequency Repatha 140mg  sq q 14 days or Praluent 75mg  sq q 14 days  Lab Orders Requested? yes  Which labs? Lipid panel  Estimated date for labs to be scheduled 2-3 months  Does patient need activated copay card? yes

## 2021-08-24 NOTE — Patient Instructions (Signed)
It was nice meeting you today!  We would like your LDL (bad cholesterol) to be less than 55  We will start you on a new medication you will inject once every 2 weeks  We will complete the prior authorization and activate a copay card for you  Once you start the medication we will recheck your cholesterol in about 2 to 3 months  Please call with any questions!  Karren Cobble, PharmD, BCACP, [place], [place], [place], [place], [address] Phone: [phone], Fax: [phone]

## 2021-08-24 NOTE — Progress Notes (Signed)
Patient ID: Jose Bennett                 DOB: August 07, 1960                    MRN: [id number]     HPI: Jose Bennett is a 61 y.o. male patient referred to lipid clinic by Jose Bennett. PMH is significant for CAD, HTN, HLD, obesity, DM (A1c 6.0), and aortic atherosclerosis seen on CT.  Patient has a 68 pack year smoking history.  Patient presents today in good spirits.  Is under the impression he is receiving an injection today.  Has experience with home injections. Wife injects him with testosterone and he self administers Ozempic.  A1c has reduced to 6.0 and he believes he has lost 10 pounds.    Reports a strong family history of CAD. Father, grandfather, uncles all with MI.    Currently managed on rosuvastatin 40mg  however LDL has not reduced.  Tolerates rosuvastatin however it causes him muscle and back pains.    Current Medications:  Rosuvastatin 40mg  daily  Intolerances: N/A  Risk Factors:  CAD T2DM Family History  LDL goal: <55  Labs: TC 246, HDL 42, LDL 177, trigs 138 (on crestor 40mg )  Past Medical History:  Diagnosis Date   Hyperlipidemia     Current Outpatient Medications on File Prior to Visit  Medication Sig Dispense Refill   amLODipine (NORVASC) 5 MG tablet Take 1 tablet (5 mg total) by mouth daily. 90 tablet 0   Ascorbic Acid (VITAMIN C) 1000 MG tablet Take 1,000 mg by mouth daily.     aspirin EC 81 MG tablet Take 81 mg by mouth daily.     Budeson-Glycopyrrol-Formoterol (BREZTRI AEROSPHERE) 160-9-4.8 MCG/ACT AERO Inhale 2 puffs into the lungs 2 (two) times daily. 10.7 g 11   CALCIUM PO Take by mouth daily.     Cholecalciferol (VITAMIN D PO) Take by mouth once a week.     lisinopril (ZESTRIL) 10 MG tablet Take 1 tablet (10 mg total) by mouth daily. 90 tablet 0   Magnesium 250 MG TABS Take by mouth daily.     rosuvastatin (CRESTOR) 40 MG tablet Take 1 tablet (40 mg total) by mouth daily. 90 tablet 3   Semaglutide, 1 MG/DOSE, (OZEMPIC, 1 MG/DOSE,) 4  MG/3ML SOPN Inject 1 mg into the skin once a week. 3 mL 5   tadalafil (CIALIS) 5 MG tablet TAKE 1 TABLET BY MOUTH DAILY AS NEEDED FOR ERECTILE DYSFUNCTION 5 tablet 11   No current facility-administered medications on file prior to visit.    No Known Allergies  Assessment/Plan:  1. Hyperlipidemia - Patient's LDL 177 which is above goal of <55 despite high intensity statin.  Suspect strong genetic component.  Aggressive LDL goal selected due to family history and T2DM.  Patient needs aggressive lipid lowering.  Using [employer] demo pen, educated patient on mechanism of action, storage, site selection, administration, and possible adverse effects.  Patient was able to demonstrate use in room.  Will complete PA and activate copay card.  Recheck lipid panel in 2-3 months.  Advised patient should continue rosuvastatin as long as he is able to tolerate it. However if it begins to interfere with his daily life, can d/c or switch to every other day dosing.  Continue rosuvastatin 40mg  daily Start Repatha/Praluent sq q 14 days Recheck lipid panel in 2-3 months  Karren Cobble, PharmD, BCACP, [place], [place] [address], [place], [place],  [place] Phone: [phone], Fax: [phone]

## 2021-08-27 NOTE — Addendum Note (Signed)
Addended by: Allean Found on: 08/27/2021 03:43 PM   Modules accepted: Orders

## 2021-08-27 NOTE — Telephone Encounter (Signed)
PA SUBMITTED FOR REPATHA 140MG  Q2W: Jose Bennett (Key: [id number])  LIPID PANEL ORDERED AND RELEASED  WILL CONTACT PT ONCE DETERMINATION IS MADE.

## 2021-08-28 MED ORDER — REPATHA SURECLICK 140 MG/ML ~~LOC~~ SOAJ
140.0000 mg | SUBCUTANEOUS | 11 refills | Status: DC
Start: 2021-08-28 — End: 2022-07-03

## 2021-08-28 NOTE — Addendum Note (Signed)
Addended by: Allean Found on: 08/28/2021 08:29 AM   Modules accepted: Orders

## 2021-08-28 NOTE — Telephone Encounter (Signed)
Called and spoke w/pt and stated rx apprved/sent to pharmacy, instructed the pt to complete fasting labs post 4th dose and copay card provided to pt. Copay card: RxBin: [id number] [place]: CN RxGrp: [id number] ID: [id number]

## 2021-09-06 ENCOUNTER — Telehealth: Payer: Self-pay

## 2021-09-06 MED ORDER — OZEMPIC (1 MG/DOSE) 4 MG/3ML ~~LOC~~ SOPN: 1.0000 mg | PEN_INJECTOR | SUBCUTANEOUS | 5 refills | Status: DC

## 2021-09-06 NOTE — Telephone Encounter (Signed)
Refilled medication

## 2021-09-06 NOTE — Telephone Encounter (Signed)
Pt is requesting a refill on: ?Semaglutide, 1 MG/DOSE, (OZEMPIC, 1 MG/DOSE,) 4 MG/3ML SOPN ? ?PHARMACY: ?[facility] [place], [place] [address] ? ?LOV 07/10/21 ?

## 2021-11-09 ENCOUNTER — Other Ambulatory Visit: Payer: Self-pay | Admitting: Emergency Medicine

## 2021-11-09 DIAGNOSIS — I10 Essential (primary) hypertension: Secondary | ICD-10-CM

## 2021-12-13 ENCOUNTER — Encounter: Payer: Self-pay | Admitting: Emergency Medicine

## 2021-12-16 ENCOUNTER — Other Ambulatory Visit: Payer: Self-pay | Admitting: Emergency Medicine

## 2021-12-16 MED ORDER — OZEMPIC (1 MG/DOSE) 4 MG/3ML ~~LOC~~ SOPN: 1.5000 mg | PEN_INJECTOR | SUBCUTANEOUS | 5 refills | Status: DC

## 2021-12-16 NOTE — Telephone Encounter (Signed)
Yes we can increase the dose.  I believe however his next dose is 1.5 mg.  New prescription sent to pharmacy of record.  Thanks.

## 2021-12-19 ENCOUNTER — Telehealth: Payer: Self-pay | Admitting: Emergency Medicine

## 2021-12-19 ENCOUNTER — Other Ambulatory Visit: Payer: Self-pay | Admitting: Emergency Medicine

## 2021-12-19 DIAGNOSIS — E1165 Type 2 diabetes mellitus with hyperglycemia: Secondary | ICD-10-CM

## 2021-12-19 MED ORDER — OZEMPIC (1 MG/DOSE) 4 MG/3ML ~~LOC~~ SOPN: 2.0000 mg | PEN_INJECTOR | SUBCUTANEOUS | 5 refills | Status: DC

## 2021-12-19 NOTE — Telephone Encounter (Signed)
Clarification on the dose. Ozempic doesn't come in 1.'5mg'$   Please advise

## 2021-12-19 NOTE — Telephone Encounter (Signed)
Colletta Maryland stated this was regarding the dosage. Sorry I didn't state that in the first message. She was saying the Ozempic doesn't come in a 1.5 mg

## 2021-12-19 NOTE — Telephone Encounter (Signed)
Jose Bennett calls today from [facility] needing clarification on PT's recent Ozempic prescription. Jose Bennett can take a verbal clarification at  [facility]: [phone]

## 2021-12-19 NOTE — Telephone Encounter (Signed)
Should be 2 mg weekly.  New prescription sent to pharmacy of record.  Thanks

## 2021-12-19 NOTE — Telephone Encounter (Signed)
What in particular needs clarification?  Dose?  Thanks.

## 2021-12-20 ENCOUNTER — Ambulatory Visit
Admission: RE | Admit: 2021-12-20 | Discharge: 2021-12-20 | Disposition: A | Discharge status: Home / Self Care | Payer: BC Managed Care – PPO | Source: Ambulatory Visit | Attending: Emergency Medicine | Admitting: Emergency Medicine

## 2021-12-20 DIAGNOSIS — Z87891 Personal history of nicotine dependence: Secondary | ICD-10-CM

## 2021-12-24 ENCOUNTER — Other Ambulatory Visit: Payer: Self-pay

## 2021-12-24 DIAGNOSIS — Z122 Encounter for screening for malignant neoplasm of respiratory organs: Secondary | ICD-10-CM

## 2021-12-24 DIAGNOSIS — Z87891 Personal history of nicotine dependence: Secondary | ICD-10-CM

## 2021-12-26 ENCOUNTER — Other Ambulatory Visit: Payer: Self-pay | Admitting: Emergency Medicine

## 2021-12-26 MED ORDER — SEMAGLUTIDE (2 MG/DOSE) 8 MG/3ML ~~LOC~~ SOPN: 2.0000 mg | PEN_INJECTOR | SUBCUTANEOUS | 3 refills | Status: DC

## 2021-12-26 NOTE — Telephone Encounter (Signed)
New prescription sent to pharmacy of record.  Thanks.

## 2022-02-07 ENCOUNTER — Other Ambulatory Visit: Payer: Self-pay | Admitting: Emergency Medicine

## 2022-02-07 DIAGNOSIS — I10 Essential (primary) hypertension: Secondary | ICD-10-CM

## 2022-04-06 NOTE — Progress Notes (Unsigned)
Cardiology Office Note   Date:  04/06/2022   ID:  Jose Bennett, DOB May 20, 1961, MRN [id number]  PCP:  Horald Pollen, MD  Cardiologist:   Terena Bohan [place], MD   No chief complaint on file.     History of Present Illness: Jose Bennett is a 61 y.o. male who is seen at the request of Dr Mitchel Honour for evaluation of CAD and LE edema. He has a history of HLD, obesity,  and HTN. CT of chest for lung screening in March 2021 showed 3 vessel coronary calcification. He is a former smoker.  Patient has gained 40 lbs in the past year and is now diabetic. He is sedentary. He notes he tires easily with yard work and does note occasional chest heaviness but has had this for some time. His ankles swell - more at night. His wife reports he snores loudly and has apneic spells. Had sleep study 3 years ago in [place] and told he has OSA but reports he couldn't use CPAP. He was prescribed metformin but hasn't started this yet. Concerned that other family members got ill on this medication. In addition to calcification his CT showed emphysema and fatty liver.   Since our last visit he notes he has started walking more and lost about 10 lbs but gained some of this back last week. He still has some swelling in his ankles late in the day. He is back using his CPAP every night and tolerating this well. Myoview and Echo results noted below.   He was seen earlier this year. Noted myalgias on Crestor. Was referred to lipid clinic with plans to start [place] in Feb. No repeat lipids since then.    Past Medical History:  Diagnosis Date   Hyperlipidemia     Past Surgical History:  Procedure Laterality Date   BRAIN SURGERY     KNEE ARTHROSCOPY     x2     Current Outpatient Medications  Medication Sig Dispense Refill   amLODipine (NORVASC) 5 MG tablet TAKE 1 TABLET(5 MG) BY MOUTH DAILY. MUST SCHEDULE APPOINTMENT WITH PROVIDER FOR FUTURE REFILLS. 90 tablet 0   Ascorbic Acid (VITAMIN C) 1000 MG  tablet Take 1,000 mg by mouth daily.     aspirin EC 81 MG tablet Take 81 mg by mouth daily.     Budeson-Glycopyrrol-Formoterol (BREZTRI AEROSPHERE) 160-9-4.8 MCG/ACT AERO Inhale 2 puffs into the lungs 2 (two) times daily. 10.7 g 11   CALCIUM PO Take by mouth daily.     Cholecalciferol (VITAMIN D PO) Take by mouth once a week.     Evolocumab (REPATHA SURECLICK) 497 MG/ML SOAJ Inject 140 mg into the skin every 14 (fourteen) days. 2 mL 11   lisinopril (ZESTRIL) 10 MG tablet TAKE 1 TABLET BY MOUTH DAILY. MUST SCHEDULE APPOINTMENT WITH PROVIDER FOR FUTURE REFILLS. 90 tablet 0   Magnesium 250 MG TABS Take by mouth daily.     rosuvastatin (CRESTOR) 40 MG tablet Take 1 tablet (40 mg total) by mouth daily. 90 tablet 3   Semaglutide, 2 MG/DOSE, 8 MG/3ML SOPN Inject 2 mg as directed once a week. 9 mL 3   tadalafil (CIALIS) 5 MG tablet TAKE 1 TABLET BY MOUTH DAILY AS NEEDED FOR ERECTILE DYSFUNCTION 5 tablet 11   No current facility-administered medications for this visit.    Allergies:   Patient has no known allergies.    Social History:  The patient  reports that he quit smoking about 14 years ago. His  smoking use included cigarettes. He has a 68.00 pack-year smoking history. He has never used smokeless tobacco. He reports current alcohol use of about 7.0 standard drinks of alcohol per week. He reports that he does not use drugs.   Family History:  The patient's family history includes CAD in his mother; Cancer in his father; Diabetes in his mother; Heart attack (age of onset: 4) in his father and paternal grandfather; Heart attack (age of onset: 67) in his maternal grandfather; Prostate cancer in his father; Thyroid disease in his sister.    ROS:  Please see the history of present illness.   Otherwise, review of systems are positive for none.   All other systems are reviewed and negative.    PHYSICAL EXAM: VS:  There were no vitals taken for this visit. , BMI There is no height or weight on file to  calculate BMI. GEN: Well nourished, morbidly obese, in no acute distress  HEENT: normal  Neck: no JVD, carotid bruits, or masses Cardiac: RRR; no murmurs, rubs, or gallops, 1+ pitting edema  Respiratory:  clear to auscultation bilaterally, normal work of breathing GI: soft, nontender, nondistended, + BS MS: no deformity or atrophy  Skin: warm and dry, no rash Neuro:  Strength and sensation are intact Psych: euthymic mood, full affect   EKG:  EKG is not ordered today.   Recent Labs: 07/10/2021: ALT 47; BUN 14; Creatinine, Ser 0.99; Hemoglobin 17.7; Platelets 267.0; Potassium 4.7; Sodium 140    Lipid Panel    Component Value Date/Time   CHOL 246 (H) 07/10/2021 1024   CHOL 224 (H) 09/19/2020 0902   TRIG 138.0 07/10/2021 1024   HDL 42.10 07/10/2021 1024   HDL 53 09/19/2020 0902   CHOLHDL 6 07/10/2021 1024   VLDL 27.6 07/10/2021 1024   LDLCALC 177 (H) 07/10/2021 1024   LDLCALC 140 (H) 09/19/2020 0902   LDLDIRECT 188.0 10/02/2009 0836      Wt Readings from Last 3 Encounters:  07/24/21 (!) 335 lb 3.2 oz (152 kg)  07/10/21 (!) 336 lb (152.4 kg)  12/25/20 (!) 331 lb 6.4 oz (150.3 kg)      Other studies Reviewed: Additional studies/ records that were reviewed today include:   Myoview 11/24/20: Study Highlights    The left ventricular ejection fraction is mildly decreased (45-54%). Nuclear stress EF: 50%. No T wave inversion was noted during stress. There was no ST segment deviation noted during stress. This is a low risk study.   No reversible ischemia. TID ratio is not elevated. LVEF 50% with normal wall motion. This is a low risk study. No prior for comparison.   Echo 12/18/20: IMPRESSIONS     1. Left ventricular ejection fraction, by estimation, is 65 to 70%. The  left ventricle has normal function. The left ventricle has no regional  wall motion abnormalities. Left ventricular diastolic parameters were  normal.   2. Right ventricular systolic function is normal.  The right ventricular  size is normal. There is normal pulmonary artery systolic pressure.   3. The mitral valve is normal in structure. No evidence of mitral valve  regurgitation.   4. The aortic valve is normal in structure. Aortic valve regurgitation is  not visualized.    ASSESSMENT AND PLAN:  1.  CAD with Coronary calcification noted on CT. Patient does tire easily. Stress Myoview showed no perfusion abnormality and EF 50%. Echo is normal. Based on these results we need to focus on risk factor modification. He is to  report any worsening chest pain or dyspnea.  2. Aortic atherosclerosis 3. Obesity 40 lb weight gain this year. Stressed need for structured weight loss program. Carb modified diet.  4. Hyperlipidemia. Goal LDL < 70. Last LDL 140. We increased Crestor to 40 mg daily. If still not at goal will need to consider adding Zetia or Repatha. Repeat fasting lab in 3 months. 5. HTN- control improved with addition of lisinopril to amlodipine.  6. DM type 2. A1c 6.9%. needs to follow up with primary care given concerns about metformin. Consider other Rx options.  7. Remote history of tobacco abuse. Some emphysema noted on CT 8. OSA now back on CPAP therapy. Encourage continued use  9. LE edema. No evidence of CHF based on Echo. May be related to amlodipine. Stable. Limit salt intake.     Disposition:   FU with me 6 months Signed, Frederick Klinger Martinique, MD  04/06/2022 10:41 AM    [facility] [address] [place], [place], [address] Phone [phone], Fax [phone]

## 2022-04-08 ENCOUNTER — Encounter: Payer: Self-pay | Admitting: Cardiology

## 2022-04-08 ENCOUNTER — Ambulatory Visit: Payer: BC Managed Care – PPO | Attending: Cardiology | Admitting: Cardiology

## 2022-04-08 VITALS — BP 154/84 | HR 75 | Ht 75.0 in | Wt 319.6 lb

## 2022-04-08 DIAGNOSIS — E1165 Type 2 diabetes mellitus with hyperglycemia: Secondary | ICD-10-CM

## 2022-04-08 DIAGNOSIS — I25118 Atherosclerotic heart disease of native coronary artery with other forms of angina pectoris: Secondary | ICD-10-CM

## 2022-04-08 DIAGNOSIS — I10 Essential (primary) hypertension: Secondary | ICD-10-CM | POA: Diagnosis not present

## 2022-04-08 DIAGNOSIS — E785 Hyperlipidemia, unspecified: Secondary | ICD-10-CM

## 2022-04-08 MED ORDER — LISINOPRIL 20 MG PO TABS: 20.0000 mg | ORAL_TABLET | Freq: Every day | ORAL | 3 refills | Status: DC

## 2022-04-08 NOTE — Patient Instructions (Signed)
Medication Instructions:  Increase Lisinopril to 20 mg daily Continue all other medications *If you need a refill on your cardiac medications before your next appointment, please call your pharmacy*   Lab Work: Bmet,lipid and hepatic panels,a1c to be done this week at [place] office   Testing/Procedures: None ordered   Follow-Up: At [facility], you and your health needs are our priority.  As part of our continuing mission to provide you with exceptional heart care, we have created designated Provider Care Teams.  These Care Teams include your primary Cardiologist (physician) and Advanced Practice Providers (APPs -  Physician Assistants and Nurse Practitioners) who all work together to provide you with the care you need, when you need it.  We recommend signing up for the patient portal called "MyChart".  Sign up information is provided on this After Visit Summary.  MyChart is used to connect with patients for Virtual Visits (Telemedicine).  Patients are able to view lab/test results, encounter notes, upcoming appointments, etc.  Non-urgent messages can be sent to your provider as well.   To learn more about what you can do with MyChart, go to [email or web address].    Your next appointment:  6 months   Call in Jan to schedule April appointment     The format for your next appointment: Office   Provider:  Dr.[name]   Important Information About Sugar

## 2022-05-05 ENCOUNTER — Other Ambulatory Visit: Payer: Self-pay | Admitting: Emergency Medicine

## 2022-05-05 DIAGNOSIS — I10 Essential (primary) hypertension: Secondary | ICD-10-CM

## 2022-06-13 LAB — BASIC METABOLIC PANEL
BUN/Creatinine Ratio: 10 (ref 10–24)
BUN: 10 mg/dL (ref 8–27)
CO2: 27 mmol/L (ref 20–29)
Calcium: 9.7 mg/dL (ref 8.6–10.2)
Chloride: 98 mmol/L (ref 96–106)
Creatinine, Ser: 1.05 mg/dL (ref 0.76–1.27)
Glucose: 94 mg/dL (ref 70–99)
Potassium: 5.2 mmol/L (ref 3.5–5.2)
Sodium: 139 mmol/L (ref 134–144)
eGFR: 81 mL/min/{1.73_m2} (ref >59)

## 2022-06-13 LAB — HEPATIC FUNCTION PANEL
ALT: 29 IU/L (ref 0–44)
AST: 21 IU/L (ref 0–40)
Albumin: 4.5 g/dL (ref 3.8–4.9)
Alkaline Phosphatase: 58 IU/L (ref 44–121)
Bilirubin Total: 1 mg/dL (ref 0.0–1.2)
Bilirubin, Direct: 0.29 mg/dL (ref 0.00–0.40)
Total Protein: 6.7 g/dL (ref 6.0–8.5)

## 2022-06-13 LAB — LIPID PANEL
Chol/HDL Ratio: 2.7 ratio (ref 0.0–5.0)
Cholesterol, Total: 125 mg/dL (ref 100–199)
HDL: 46 mg/dL (ref >39)
LDL Chol Calc (NIH): 60 mg/dL (ref 0–99)
Triglycerides: 102 mg/dL (ref 0–149)
VLDL Cholesterol Cal: 19 mg/dL (ref 5–40)

## 2022-06-13 LAB — HEMOGLOBIN A1C
Est. average glucose Bld gHb Est-mCnc: 117 mg/dL
Hgb A1c MFr Bld: 5.7 % — ABNORMAL HIGH (ref 4.8–5.6)

## 2022-07-03 ENCOUNTER — Telehealth: Payer: Self-pay | Admitting: *Deleted

## 2022-07-03 ENCOUNTER — Other Ambulatory Visit: Payer: Self-pay | Admitting: Cardiology

## 2022-07-03 NOTE — Telephone Encounter (Signed)
Pa for ozempic  [id number]

## 2022-07-04 NOTE — Telephone Encounter (Signed)
PA approved 07/15/2022 -07/16/2023

## 2022-09-12 ENCOUNTER — Encounter: Payer: Self-pay | Admitting: Cardiology

## 2022-09-12 NOTE — Telephone Encounter (Signed)
error 

## 2022-09-13 ENCOUNTER — Other Ambulatory Visit: Payer: Self-pay | Admitting: Pharmacist

## 2022-09-14 ENCOUNTER — Encounter: Payer: Self-pay | Admitting: Cardiology

## 2022-09-16 ENCOUNTER — Telehealth: Payer: Self-pay

## 2022-09-16 ENCOUNTER — Telehealth: Payer: Self-pay | Admitting: Pharmacist Clinician (PhC)/ Clinical Pharmacy Specialist

## 2022-09-16 ENCOUNTER — Other Ambulatory Visit (HOSPITAL_COMMUNITY): Payer: Self-pay

## 2022-09-16 NOTE — Telephone Encounter (Signed)
Pharmacy Patient Advocate Encounter   Received notification from [place] that prior authorization for REPATHA 140 MG/ML INJ is needed.    PA submitted on 09/16/22 Key [id number] Status is pending  Karie Soda, [place] Patient Advocate Specialist Direct Number: [phone] Fax: [phone]

## 2022-09-16 NOTE — Telephone Encounter (Signed)
Please do renewal for Repatha

## 2022-09-16 NOTE — Telephone Encounter (Signed)
Pharmacy Patient Advocate Encounter  Prior Authorization for REPATHA 140 MG/ML INJ has been approved.    Effective dates: 08/17/22 through 09/16/23  Karie Soda, [place] Patient Advocate Specialist Direct Number: [phone] Fax: [phone]

## 2022-12-02 ENCOUNTER — Other Ambulatory Visit: Payer: Self-pay | Admitting: Acute Care

## 2022-12-02 DIAGNOSIS — Z122 Encounter for screening for malignant neoplasm of respiratory organs: Secondary | ICD-10-CM

## 2022-12-02 DIAGNOSIS — Z87891 Personal history of nicotine dependence: Secondary | ICD-10-CM

## 2022-12-17 ENCOUNTER — Other Ambulatory Visit: Payer: Self-pay | Admitting: Emergency Medicine

## 2022-12-30 ENCOUNTER — Ambulatory Visit
Admission: RE | Admit: 2022-12-30 | Discharge: 2022-12-30 | Disposition: A | Discharge status: Home / Self Care | Payer: BC Managed Care – PPO | Source: Ambulatory Visit | Attending: Acute Care | Admitting: Acute Care

## 2022-12-30 DIAGNOSIS — Z87891 Personal history of nicotine dependence: Secondary | ICD-10-CM

## 2022-12-30 DIAGNOSIS — Z122 Encounter for screening for malignant neoplasm of respiratory organs: Secondary | ICD-10-CM

## 2023-01-03 ENCOUNTER — Other Ambulatory Visit: Payer: Self-pay | Admitting: Acute Care

## 2023-01-03 DIAGNOSIS — Z87891 Personal history of nicotine dependence: Secondary | ICD-10-CM

## 2023-01-03 DIAGNOSIS — Z122 Encounter for screening for malignant neoplasm of respiratory organs: Secondary | ICD-10-CM

## 2023-01-04 ENCOUNTER — Other Ambulatory Visit: Payer: Self-pay | Admitting: Cardiology

## 2023-02-08 ENCOUNTER — Other Ambulatory Visit: Payer: Self-pay | Admitting: Emergency Medicine

## 2023-02-08 DIAGNOSIS — I10 Essential (primary) hypertension: Secondary | ICD-10-CM

## 2023-02-19 ENCOUNTER — Encounter: Payer: Self-pay | Admitting: Emergency Medicine

## 2023-02-26 ENCOUNTER — Other Ambulatory Visit (HOSPITAL_COMMUNITY): Payer: Self-pay

## 2023-02-26 ENCOUNTER — Ambulatory Visit: Payer: BC Managed Care – PPO | Admitting: Emergency Medicine

## 2023-02-26 ENCOUNTER — Encounter: Payer: Self-pay | Admitting: Emergency Medicine

## 2023-02-26 VITALS — BP 138/88 | HR 65 | Temp 98.1°F | Ht 75.0 in | Wt 329.4 lb

## 2023-02-26 DIAGNOSIS — G4733 Obstructive sleep apnea (adult) (pediatric): Secondary | ICD-10-CM

## 2023-02-26 DIAGNOSIS — E1159 Type 2 diabetes mellitus with other circulatory complications: Secondary | ICD-10-CM

## 2023-02-26 DIAGNOSIS — I700 Atherosclerosis of aorta: Secondary | ICD-10-CM

## 2023-02-26 DIAGNOSIS — Z1211 Encounter for screening for malignant neoplasm of colon: Secondary | ICD-10-CM | POA: Diagnosis not present

## 2023-02-26 DIAGNOSIS — J432 Centrilobular emphysema: Secondary | ICD-10-CM

## 2023-02-26 DIAGNOSIS — I152 Hypertension secondary to endocrine disorders: Secondary | ICD-10-CM

## 2023-02-26 DIAGNOSIS — G720 Drug-induced myopathy: Secondary | ICD-10-CM

## 2023-02-26 DIAGNOSIS — T466X5A Adverse effect of antihyperlipidemic and antiarteriosclerotic drugs, initial encounter: Secondary | ICD-10-CM

## 2023-02-26 DIAGNOSIS — I2510 Atherosclerotic heart disease of native coronary artery without angina pectoris: Secondary | ICD-10-CM

## 2023-02-26 MED ORDER — WEGOVY 1.7 MG/0.75ML ~~LOC~~ SOAJ
1.7000 mg | SUBCUTANEOUS | 5 refills | Status: DC
Start: 2023-02-26 — End: 2023-03-04
  Filled 2023-02-26 – 2023-02-28 (×2): qty 3, 28d supply, fill #0

## 2023-02-26 NOTE — Assessment & Plan Note (Signed)
Presently asymptomatic.  Not using Breztri inhaler.

## 2023-02-26 NOTE — Assessment & Plan Note (Signed)
BP Readings from Last 3 Encounters:  02/26/23 138/88  04/08/22 (!) 154/84  07/24/21 (!) 150/82  Well-controlled hypertension Continue amlodipine 5 mg daily, lisinopril 20 mg daily Hemoglobin A1c done today Lab Results  Component Value Date   HGBA1C 5.7 (H) 06/12/2022  Recommend to stop Ozempic and start Wegovy 1.7 mg weekly Diet and nutrition discussed Benefits of exercise discussed Follow-up in 3 months

## 2023-02-26 NOTE — Progress Notes (Signed)
Jose Bennett 62 y.o.   Chief Complaint  Patient presents with   Weight Loss    Patient states he wants to start taking medication for weight loss.     HISTORY OF PRESENT ILLNESS: This is a 62 y.o. male here for follow-up of multiple chronic medical conditions. History of diabetes on Ozempic but not helping to lose weight despite 2 mg weekly.  Requesting to be changed to [name]. No other complaints or medical concerns today. Wt Readings from Last 3 Encounters:  02/26/23 (!) 329 lb 6 oz (149.4 kg)  04/08/22 (!) 319 lb 9.6 oz (145 kg)  07/24/21 (!) 335 lb 3.2 oz (152 kg)     HPI   Prior to Admission medications   Medication Sig Start Date End Date Taking? Authorizing Provider  amLODipine (NORVASC) 5 MG tablet TAKE 1 TABLET(5 MG) BY MOUTH DAILY 02/08/23  Yes Neziah Braley, Eilleen Kempf, MD  Ascorbic Acid (VITAMIN C) 1000 MG tablet Take 1,000 mg by mouth daily.   Yes [provider]  aspirin EC 81 MG tablet Take 81 mg by mouth daily.   Yes [provider]  CALCIUM PO Take by mouth daily.   Yes [provider]  Cholecalciferol (VITAMIN D PO) Take by mouth once a week.   Yes [provider]  lisinopril (ZESTRIL) 20 MG tablet Take 1 tablet (20 mg total) by mouth daily. 04/08/22 04/03/23 Yes [place], [name], MD  Magnesium 250 MG TABS Take by mouth daily.   Yes [provider]  REPATHA SURECLICK 140 MG/ML SOAJ ADMINISTER 1 ML UNDER THE SKIN EVERY 14 DAYS 07/03/22  Yes [place], [name], MD  Semaglutide-Weight Management (WEGOVY) 1.7 MG/0.75ML SOAJ Inject 1.7 mg into the skin once a week. 02/26/23  Yes Koree Schopf, Eilleen Kempf, MD  tadalafil (CIALIS) 5 MG tablet TAKE 1 TABLET BY MOUTH DAILY AS NEEDED FOR ERECTILE DYSFUNCTION 09/21/20  Yes Muneer Leider, Eilleen Kempf, MD  Budeson-Glycopyrrol-Formoterol (BREZTRI AEROSPHERE) 160-9-4.8 MCG/ACT AERO Inhale 2 puffs into the lungs 2 (two) times daily. Patient not taking: Reported on 02/26/2023 07/10/21   Georgina Quint, MD    No Known Allergies  Patient Active Problem List   Diagnosis Date Noted   Type 2 diabetes mellitus with hyperglycemia, without long-term current use of insulin (HCC) 07/10/2021   Atherosclerosis of native coronary artery of native heart without angina pectoris 09/19/2020   Centrilobular emphysema (HCC) 09/19/2020   Hepatic steatosis 09/19/2020   Hypertension associated with diabetes (HCC) 09/19/2020   Morbid obesity (HCC) 09/19/2020   Aortic atherosclerosis (HCC) 09/19/2020   ABNORMAL INVOLUNTARY MOVEMENTS 09/26/2009   ADD 10/05/2007   Dyslipidemia 03/23/2007    Past Medical History:  Diagnosis Date   Hyperlipidemia     Past Surgical History:  Procedure Laterality Date   BRAIN SURGERY     KNEE ARTHROSCOPY     x2    Social History   Socioeconomic History   Marital status: Married    Spouse name: Not on file   Number of children: 2   Years of education: Not on file   Highest education level: Not on file  Occupational History   Occupation: sales- equipment  Tobacco Use   Smoking status: Former    Current packs/day: 0.00    Average packs/day: 2.0 packs/day for 34.0 years (68.0 ttl pk-yrs)    Types: Cigarettes    Start date: 07/22/1973    Quit date: 07/23/2007    Years since quitting: 15.6   Smokeless tobacco: Never  Substance and Sexual Activity   Alcohol use: Yes    Alcohol/week: 7.0 standard drinks of alcohol    Types: 7 Glasses of wine per week   Drug use: No   Sexual activity: Not on file  Other Topics Concern   Not on file  Social History Narrative   Not on file   Social Determinants of Health   Financial Resource Strain: Not on file  Food Insecurity: Not on file  Transportation Needs: Not on file  Physical Activity: Not on file  Stress: Not on file  Social Connections: Not on file  Intimate Partner Violence: Not on file    Family History  Problem Relation Age of Onset   Prostate cancer Father    Heart attack Father 16       smoker    Cancer Father        LUNG   Diabetes Mother    CAD Mother    Thyroid disease Sister    Heart attack Maternal Grandfather 25   Heart attack Paternal Grandfather 34     Review of Systems  Constitutional: Negative.  Negative for chills and fever.  HENT: Negative.  Negative for congestion and sore throat.   Respiratory: Negative.  Negative for cough and shortness of breath.   Cardiovascular: Negative.  Negative for chest pain and palpitations.  Gastrointestinal:  Negative for abdominal pain and vomiting.  Genitourinary: Negative.   Skin: Negative.  Negative for rash.  Neurological: Negative.  Negative for dizziness and headaches.  All other systems reviewed and are negative.   Vitals:   02/26/23 1352  BP: 138/88  Pulse: 65  Temp: 98.1 F (36.7 C)  SpO2: 94%    Physical Exam Vitals reviewed.  Constitutional:      Appearance: Normal appearance. He is obese.  HENT:     Head: Normocephalic.  Eyes:     Extraocular Movements: Extraocular movements intact.  Cardiovascular:     Rate and Rhythm: Normal rate.  Pulmonary:     Effort: Pulmonary effort is normal.  Skin:    General: Skin is warm and dry.  Neurological:     Mental Status: He is alert and oriented to person, place, and time.  Psychiatric:        Mood and Affect: Mood normal.        Behavior: Behavior normal.      ASSESSMENT & PLAN: A total of 45 minutes was spent with the patient and counseling/coordination of care regarding preparing for this visit, review of most recent office visit notes, review of multiple chronic medical conditions under management, review of all medications, review of most recent blood work results, cardiovascular risks associated with diabetes and hypertension, education on nutrition, prognosis, documentation, and need for follow-up.  Problem List Items Addressed This Visit       Cardiovascular and Mediastinum   Atherosclerosis of native coronary artery of native heart without  angina pectoris    Clinically stable.  No anginal episodes.  Continues daily baby aspirin      Hypertension associated with diabetes (HCC) - Primary    BP Readings from Last 3 Encounters:  02/26/23 138/88  04/08/22 (!) 154/84  07/24/21 (!) 150/82  Well-controlled hypertension Continue amlodipine 5 mg daily, lisinopril 20 mg daily Hemoglobin A1c done today Lab Results  Component Value Date   HGBA1C 5.7 (H) 06/12/2022  Recommend to stop Ozempic and start Wegovy 1.7 mg weekly Diet and nutrition discussed Benefits of exercise discussed Follow-up in 3 months  Relevant Medications   Semaglutide-Weight Management (WEGOVY) 1.7 MG/0.75ML SOAJ   Other Relevant Orders   CBC with Differential/Platelet   Comprehensive metabolic panel   Lipid panel   Hemoglobin A1c   Aortic atherosclerosis (HCC)    Diet and nutrition discussed Presently on Repatha        Respiratory   Centrilobular emphysema (HCC)    Presently asymptomatic.  Not using Breztri inhaler.      Obstructive sleep apnea    Using CPAP intermittently        Musculoskeletal and Integument   Statin myopathy     Other   Morbid obesity (HCC)    Diet and nutrition discussed. Advised to decrease amount of daily carbohydrate intake and daily calories and increase amount of plant-based protein in his diet Recommend to stop Ozempic and start Wegovy 1.7 mg weekly       Relevant Medications   Semaglutide-Weight Management (WEGOVY) 1.7 MG/0.75ML SOAJ   Other Visit Diagnoses     Screening for colon cancer       Relevant Orders   Cologuard      Patient Instructions  Diabetes Mellitus and Nutrition, Adult When you have diabetes, or diabetes mellitus, it is very important to have healthy eating habits because your blood sugar (glucose) levels are greatly affected by what you eat and drink. Eating healthy foods in the right amounts, at about the same times every day, can help you: Manage your blood  glucose. Lower your risk of heart disease. Improve your blood pressure. Reach or maintain a healthy weight. What can affect my meal plan? Every person with diabetes is different, and each person has different needs for a meal plan. Your health care provider may recommend that you work with a dietitian to make a meal plan that is best for you. Your meal plan may vary depending on factors such as: The calories you need. The medicines you take. Your weight. Your blood glucose, blood pressure, and cholesterol levels. Your activity level. Other health conditions you have, such as heart or kidney disease. How do carbohydrates affect me? Carbohydrates, also called carbs, affect your blood glucose level more than any other type of food. Eating carbs raises the amount of glucose in your blood. It is important to know how many carbs you can safely have in each meal. This is different for every person. Your dietitian can help you calculate how many carbs you should have at each meal and for each snack. How does alcohol affect me? Alcohol can cause a decrease in blood glucose (hypoglycemia), especially if you use insulin or take certain diabetes medicines by mouth. Hypoglycemia can be a life-threatening condition. Symptoms of hypoglycemia, such as sleepiness, dizziness, and confusion, are similar to symptoms of having too much alcohol. Do not drink alcohol if: Your health care provider tells you not to drink. You are pregnant, may be pregnant, or are planning to become pregnant. If you drink alcohol: Limit how much you have to: 0-1 drink a day for women. 0-2 drinks a day for men. Know how much alcohol is in your drink. In the [place] one drink equals one 12 oz bottle of beer (355 mL), one 5 oz glass of wine (148 mL), or one 1 oz glass of hard liquor (44 mL). Keep yourself hydrated with water, diet soda, or unsweetened iced tea. Keep in mind that regular soda, juice, and other mixers may contain a lot of  sugar and must be counted as carbs. What are tips  for following this plan?  Reading food labels Start by checking the serving size on the Nutrition Facts label of packaged foods and drinks. The number of calories and the amount of carbs, fats, and other nutrients listed on the label are based on one serving of the item. Many items contain more than one serving per package. Check the total grams (g) of carbs in one serving. Check the number of grams of saturated fats and trans fats in one serving. Choose foods that have a low amount or none of these fats. Check the number of milligrams (mg) of salt (sodium) in one serving. Most people should limit total sodium intake to less than 2,300 mg per day. Always check the nutrition information of foods labeled as "low-fat" or "nonfat." These foods may be higher in added sugar or refined carbs and should be avoided. Talk to your dietitian to identify your daily goals for nutrients listed on the label. Shopping Avoid buying canned, pre-made, or processed foods. These foods tend to be high in fat, sodium, and added sugar. Shop around the outside edge of the grocery store. This is where you will most often find fresh fruits and vegetables, bulk grains, fresh meats, and fresh dairy products. Cooking Use low-heat cooking methods, such as baking, instead of high-heat cooking methods, such as deep frying. Cook using healthy oils, such as olive, canola, or sunflower oil. Avoid cooking with butter, cream, or high-fat meats. Meal planning Eat meals and snacks regularly, preferably at the same times every day. Avoid going long periods of time without eating. Eat foods that are high in fiber, such as fresh fruits, vegetables, beans, and whole grains. Eat 4-6 oz (112-168 g) of lean protein each day, such as lean meat, chicken, fish, eggs, or tofu. One ounce (oz) (28 g) of lean protein is equal to: 1 oz (28 g) of meat, chicken, or fish. 1 egg.  cup (62 g) of  tofu. Eat some foods each day that contain healthy fats, such as avocado, nuts, seeds, and fish. What foods should I eat? Fruits Berries. Apples. Oranges. Peaches. Apricots. Plums. Grapes. Mangoes. Papayas. Pomegranates. Kiwi. Cherries. Vegetables Leafy greens, including lettuce, spinach, kale, chard, collard greens, mustard greens, and cabbage. Beets. Cauliflower. Broccoli. Carrots. Green beans. Tomatoes. Peppers. Onions. Cucumbers. Brussels sprouts. Grains Whole grains, such as whole-wheat or whole-grain bread, crackers, tortillas, cereal, and pasta. Unsweetened oatmeal. Quinoa. Brown or wild rice. Meats and other proteins Seafood. Poultry without skin. Lean cuts of poultry and beef. Tofu. Nuts. Seeds. Dairy Low-fat or fat-free dairy products such as milk, yogurt, and cheese. The items listed above may not be a complete list of foods and beverages you can eat and drink. Contact a dietitian for more information. What foods should I avoid? Fruits Fruits canned with syrup. Vegetables Canned vegetables. Frozen vegetables with butter or cream sauce. Grains Refined white flour and flour products such as bread, pasta, snack foods, and cereals. Avoid all processed foods. Meats and other proteins Fatty cuts of meat. Poultry with skin. Breaded or fried meats. Processed meat. Avoid saturated fats. Dairy Full-fat yogurt, cheese, or milk. Beverages Sweetened drinks, such as soda or iced tea. The items listed above may not be a complete list of foods and beverages you should avoid. Contact a dietitian for more information. Questions to ask a health care provider Do I need to meet with a certified diabetes care and education specialist? Do I need to meet with a dietitian? What number can I call if I  have questions? When are the best times to check my blood glucose? Where to find more information: American Diabetes Association: diabetes.org Academy of Nutrition and Dietetics:  eatright.[employer] of Diabetes and Digestive and Kidney Diseases: StageSync.si Association of Diabetes Care & Education Specialists: diabeteseducator.org Summary It is important to have healthy eating habits because your blood sugar (glucose) levels are greatly affected by what you eat and drink. It is important to use alcohol carefully. A healthy meal plan will help you manage your blood glucose and lower your risk of heart disease. Your health care provider may recommend that you work with a dietitian to make a meal plan that is best for you. This information is not intended to replace advice given to you by your health care provider. Make sure you discuss any questions you have with your health care provider. Document Revised: 01/26/2020 Document Reviewed: 01/26/2020 Elsevier Patient Education  2024 Elsevier Inc.      Edwina Barth, MD [facility]

## 2023-02-26 NOTE — Assessment & Plan Note (Signed)
Diet and nutrition discussed Presently on Repatha

## 2023-02-26 NOTE — Patient Instructions (Signed)

## 2023-02-26 NOTE — Assessment & Plan Note (Signed)
Using CPAP intermittently

## 2023-02-26 NOTE — Assessment & Plan Note (Signed)
Clinically stable.  No anginal episodes.  Continues daily baby aspirin

## 2023-02-26 NOTE — Assessment & Plan Note (Signed)
Diet and nutrition discussed. Advised to decrease amount of daily carbohydrate intake and daily calories and increase amount of plant-based protein in his diet Recommend to stop Ozempic and start Wegovy 1.7 mg weekly

## 2023-02-28 ENCOUNTER — Other Ambulatory Visit (HOSPITAL_COMMUNITY): Payer: Self-pay

## 2023-02-28 NOTE — Telephone Encounter (Signed)
Patient needs a PA done for the [facility] [place]

## 2023-03-03 ENCOUNTER — Other Ambulatory Visit (HOSPITAL_COMMUNITY): Payer: Self-pay

## 2023-03-03 ENCOUNTER — Telehealth: Payer: Self-pay

## 2023-03-03 NOTE — Telephone Encounter (Signed)
Pharmacy Patient Advocate Encounter   Received notification from Patient Advice Request messages that prior authorization for Wegovy 1.7MG /0.75ML auto-injectors is required/requested.   Insurance verification completed.   The patient is insured through [facility]  .   Per test claim: PA required; PA submitted to [facility] via CoverMyMeds Key/confirmation #/EOC [id number] Status is pending

## 2023-03-04 ENCOUNTER — Other Ambulatory Visit (HOSPITAL_COMMUNITY): Payer: Self-pay

## 2023-03-04 MED ORDER — SEMAGLUTIDE (1 MG/DOSE) 4 MG/3ML ~~LOC~~ SOPN: 1.0000 mg | PEN_INJECTOR | SUBCUTANEOUS | 3 refills | Status: DC

## 2023-03-04 NOTE — Telephone Encounter (Signed)
Okay to start the Ozempic at 1 mg weekly

## 2023-03-04 NOTE — Addendum Note (Signed)
Addended by: Jerrell Belfast on: 03/04/2023 03:18 PM   Modules accepted: Orders

## 2023-03-04 NOTE — Telephone Encounter (Signed)
Pharmacy Patient Advocate Encounter  Received notification from [facility]  that Prior Authorization for [facility] 1.7MG /0.75ML auto-injectors has been DENIED. Please advise how you'd like to proceed. Full denial letter will be uploaded to the media tab. See denial reason below.   PA #/Case ID/Reference #: [id number]   Denial reason: Plan exclusion

## 2023-03-08 LAB — COLOGUARD: COLOGUARD: NEGATIVE

## 2023-04-07 ENCOUNTER — Encounter: Payer: Self-pay | Admitting: Emergency Medicine

## 2023-04-07 ENCOUNTER — Other Ambulatory Visit: Payer: Self-pay | Admitting: Emergency Medicine

## 2023-04-07 DIAGNOSIS — J432 Centrilobular emphysema: Secondary | ICD-10-CM

## 2023-04-07 DIAGNOSIS — R0609 Other forms of dyspnea: Secondary | ICD-10-CM

## 2023-04-07 MED ORDER — BREZTRI AEROSPHERE 160-9-4.8 MCG/ACT IN AERO
2.0000 | INHALATION_SPRAY | Freq: Two times a day (BID) | RESPIRATORY_TRACT | 11 refills | Status: AC
Start: 2023-04-07 — End: ?

## 2023-04-07 NOTE — Telephone Encounter (Signed)
Refill for [facility] sent to pharmacy of record today.

## 2023-06-15 ENCOUNTER — Encounter: Payer: Self-pay | Admitting: Cardiology

## 2023-06-16 ENCOUNTER — Other Ambulatory Visit: Payer: Self-pay | Admitting: Pharmacist

## 2023-06-16 MED ORDER — REPATHA SURECLICK 140 MG/ML ~~LOC~~ SOAJ: 140.0000 mg | SUBCUTANEOUS | 0 refills | Status: DC

## 2023-06-16 NOTE — Telephone Encounter (Signed)
Attempted to call patient to offer appointment on 08/07/23. No answer left a message for patient to call back. Will offer appointment through MyChart.

## 2023-06-19 IMAGING — CT CT CHEST LUNG CANCER SCREENING LOW DOSE W/O CM
1 series · 15 of 32 positions shown, 19 images · non-contrast
Comparison: 12/20/2020

CLINICAL DATA: Lung cancer screening. Sixty-eight pack-year smoking
history. Former asymptomatic smoker.



[Series 2: ldct screen -soft · axial · 0.98mm/px · z∈[-362,-28]mm · 15 of 76 slices shown, 19 images]
[im 6/76  mediastinal]
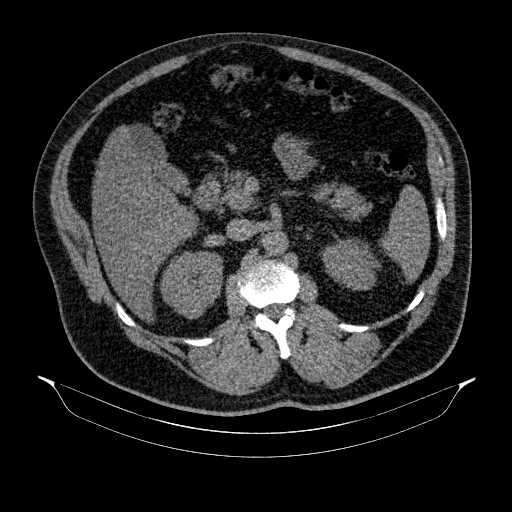
[im 6/76  lung]
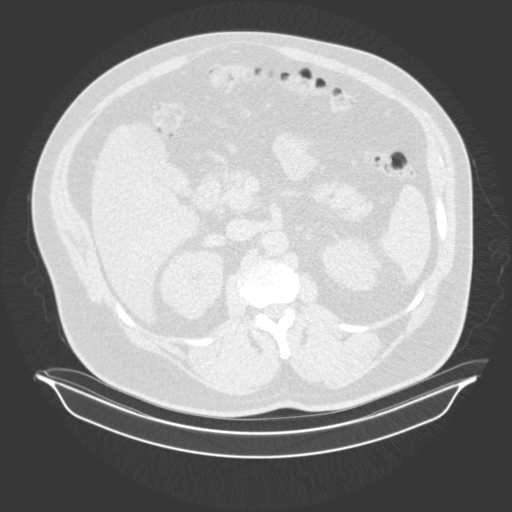
[im 12/76  lung]
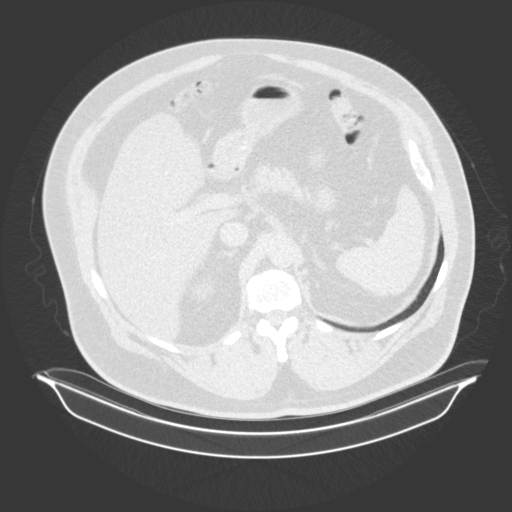
[im 16/76  lung]
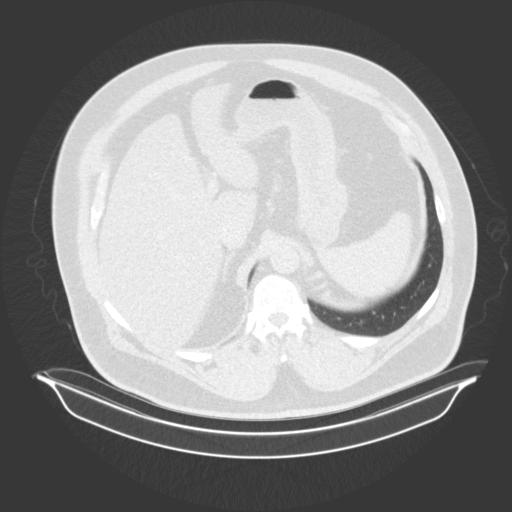
[im 20/76  lung]
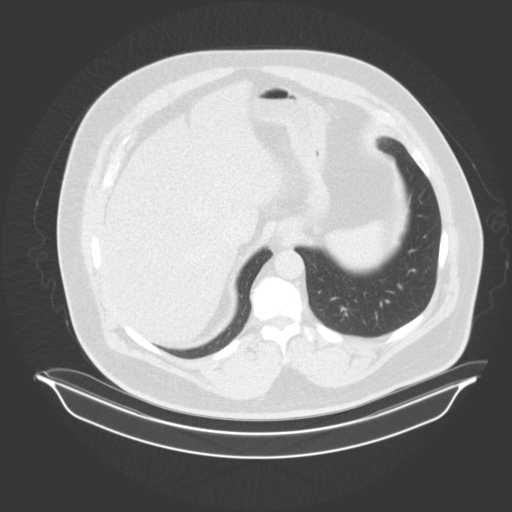
[im 26/76  mediastinal]
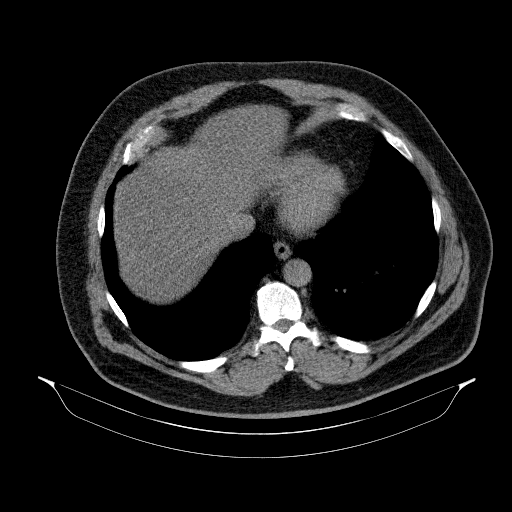
[im 26/76  lung]
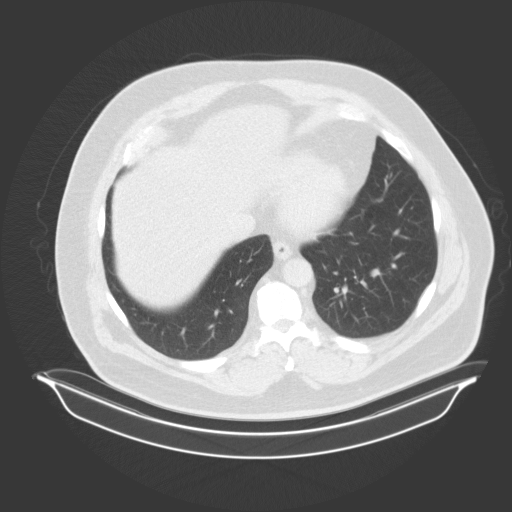
[im 31/76  lung]
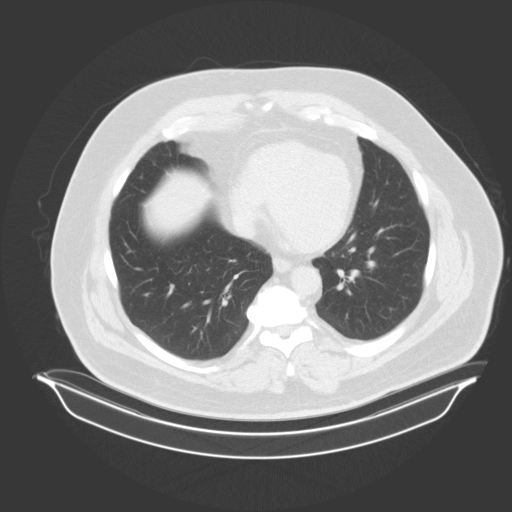
[im 37/76  lung]
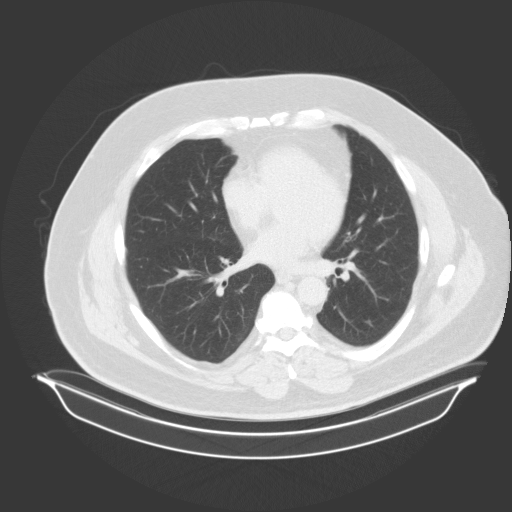
[im 40/76  lung]
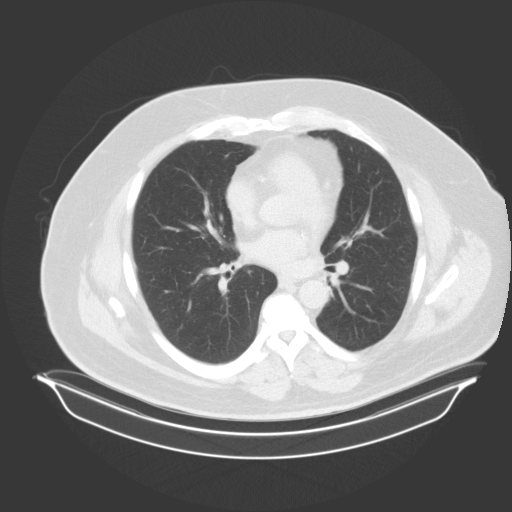
[im 45/76  mediastinal]
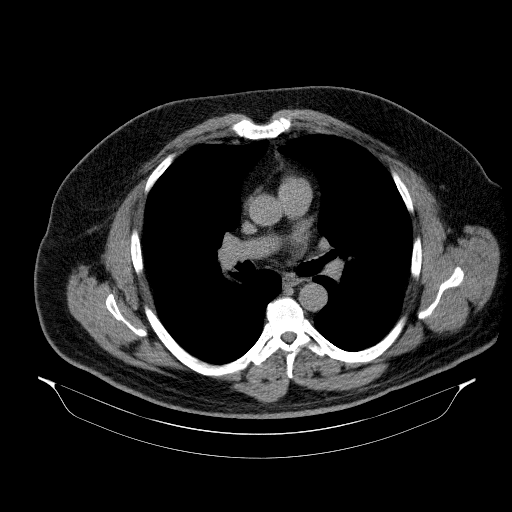
[im 45/76  lung]
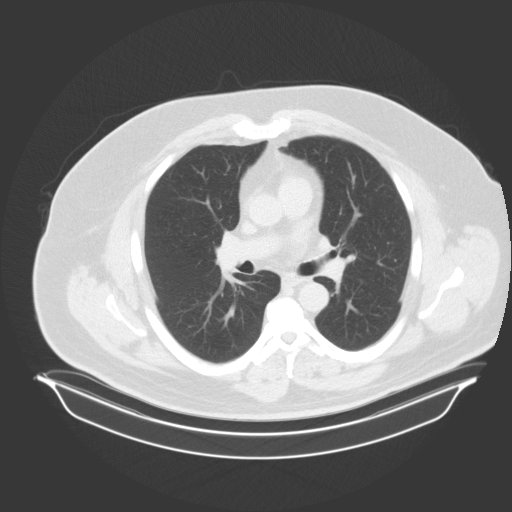
[im 48/76  lung]
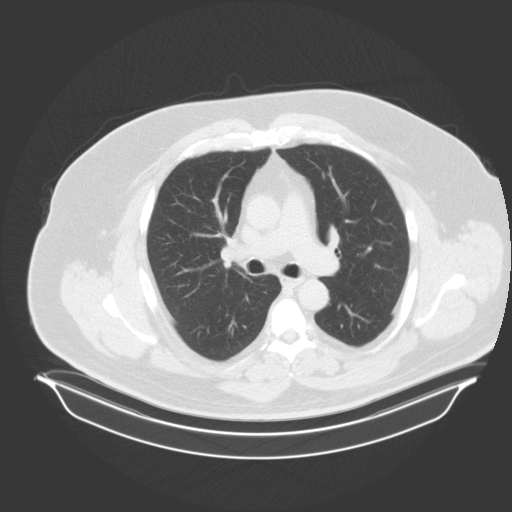
[im 53/76  lung]
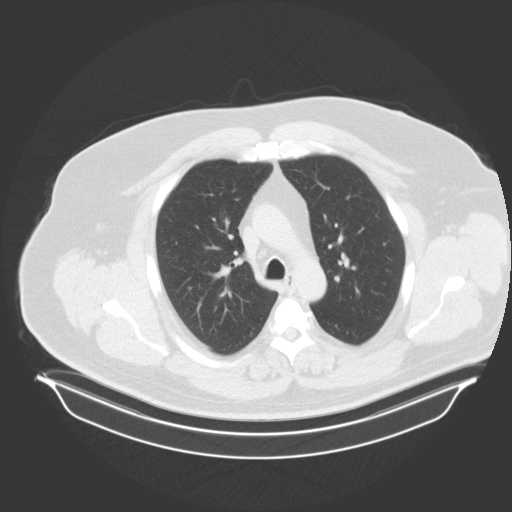
[im 59/76  lung]
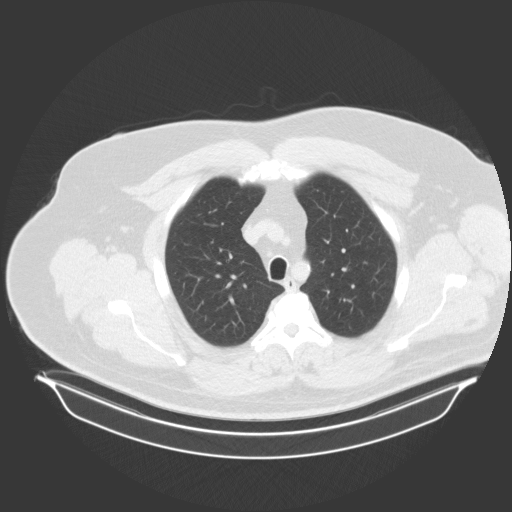
[im 62/76  mediastinal]
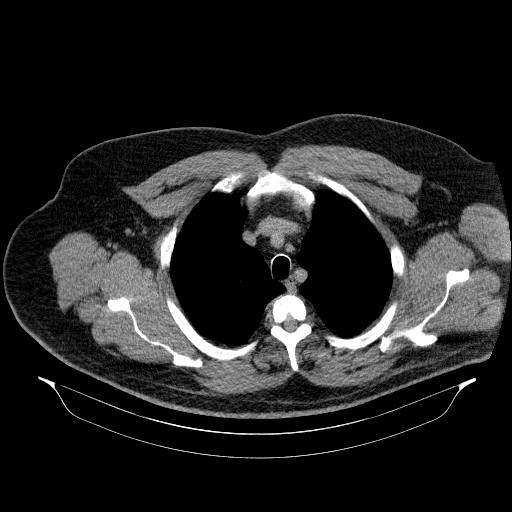
[im 62/76  lung]
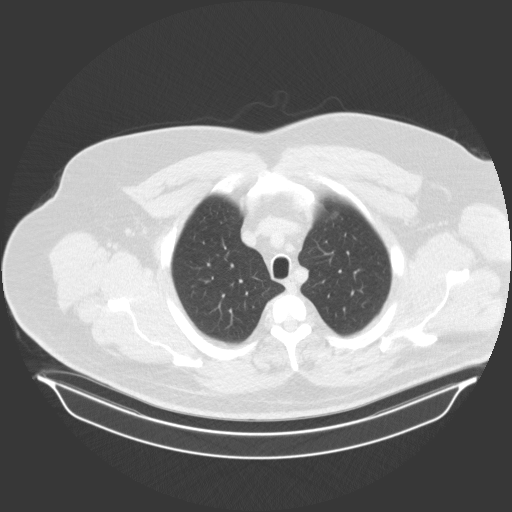
[im 67/76  lung]
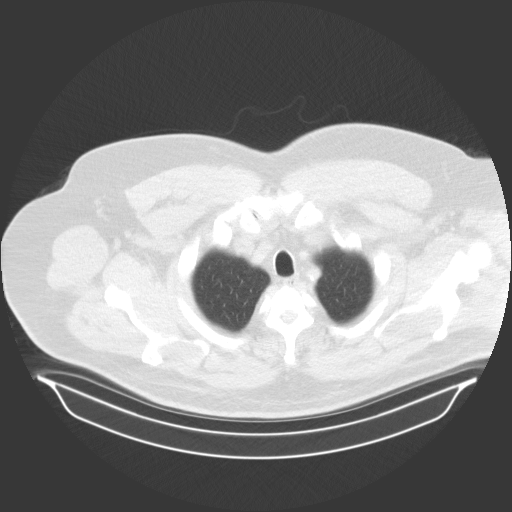
[im 73/76  lung]
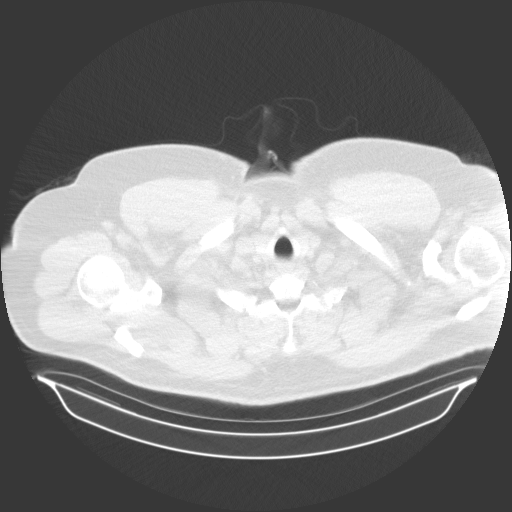

[15 of 32 positions shown; findings below may reference images not displayed]

FINDINGS: Cardiovascular: Heart size appears within normal limits. No
pericardial effusion. Mild aortic atherosclerosis. Coronary artery
calcifications.

Mediastinum/Nodes: Thyroid gland, trachea, and esophagus are
unremarkable. No enlarged axillary, supraclavicular, or mediastinal
lymph nodes.

Lungs/Pleura: Mild centrilobular emphysema. No pleural effusion,
airspace consolidation, atelectasis or pneumothorax. Unchanged 3 mm
lung nodule within the posterior left lung base. No new or
suspicious lung nodules.

Upper Abdomen: No acute findings within the imaged portions of the
upper abdomen. Hepatic steatosis noted.

Musculoskeletal: No chest wall mass or suspicious bone lesions
identified.
IMPRESSION: 1. Lung-RADS 2, benign appearance or behavior. Continue annual
screening with low-dose chest CT without contrast in 12 months.
2. Coronary artery calcifications.
3. Hepatic steatosis.
4. Aortic Atherosclerosis (EZTN5-64H.H) and Emphysema ([id number].G).

## 2023-06-20 ENCOUNTER — Other Ambulatory Visit: Payer: Self-pay | Admitting: Cardiology

## 2023-07-15 NOTE — Progress Notes (Signed)
 Cardiology Clinic Note   Patient Name: Jose Bennett Date of Encounter: 07/18/2023  Primary Care Provider:  [name], MD Primary Cardiologist:  [name], MD  Patient Profile    Jose Bennett 63 year old male presents to the clinic today for follow-up evaluation of his coronary artery disease and hyperlipidemia.  Past Medical History    Past Medical History:  Diagnosis Date   Hyperlipidemia    Past Surgical History:  Procedure Laterality Date   BRAIN SURGERY     KNEE ARTHROSCOPY     x2    Allergies  No Known Allergies  History of Present Illness    Jose Bennett has a PMH of HTN, HLD, CAD, and type 2 diabetes.  He was initially evaluated by his PCP and referred to cardiology for evaluation of his coronary artery disease and lower extremity swelling.  His chest CT 3/21 showed three-vessel coronary calcification.  He is also a former smoker.  Nuclear stress test 5/22 showed low risk and no ischemia.  Echocardiogram 12/18/2021 showed LVEF of 65-70% and no significant valvular abnormalities.  On evaluation by Dr. [name] he had gained 40 pounds over the previous year and developed diabetes.  He reported that he was sedentary.  He noted that he would tire easily with yard work and did note occasional chest heaviness.  He has had this for quite some time.  He did notice more ankle swelling at night.  He underwent sleep study previously in [place] and was told he had OSA.  He was unable to tolerate CPAP therapy.  He was prescribed metformin  but had not yet started it.  He had some concern about family members becoming ill on the medication.  He was last seen by Dr. [name] on 04/08/2022.  During that time he reported that he was walking more and had lost around 16 pounds.  He was occasionally using his CPAP and tolerating it fairly well.  He did note myalgias on rosuvastatin  and was referred to the lipid clinic for Repatha .  He reported elevated blood  pressure.  His lisinopril  was increased to 20 mg daily.   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  He reports that lisinopril  gave him a headache.  He stopped taking his lisinopril  and he increased his amlodipine  to 10 mg daily.  His blood pressure initially today is 152/102 and on recheck is 142/86.  We reviewed secondary causes of hypertension.  He reports that he needs a refill of his Repatha .  I will send in a new prescription for his amlodipine  reflecting 10 mg, start HCTZ 12.5 mg daily, have him increase his physical activity as tolerated and plan follow-up in 9 to 12 months.  Today he denies chest pain, shortness of breath, lower extremity edema, fatigue, palpitations, melena, hematuria, hemoptysis, diaphoresis, weakness, presyncope, syncope, orthopnea, and PND.    Home Medications    Prior to Admission medications   Medication Sig Start Date End Date Taking? Authorizing Provider  amLODipine  (NORVASC ) 5 MG tablet TAKE 1 TABLET(5 MG) BY MOUTH DAILY 02/08/23   [name], MD  Ascorbic Acid (VITAMIN C) 1000 MG tablet Take 1,000 mg by mouth daily.    [provider]  aspirin EC 81 MG tablet Take 81 mg by mouth daily.    [provider]  Budeson-Glycopyrrol-Formoterol (BREZTRI  AEROSPHERE) 160-9-4.8 MCG/ACT AERO Inhale 2 puffs into the lungs 2 (two)  times daily. 04/07/23   [name], MD  CALCIUM  PO Take by mouth daily.    [provider]  Cholecalciferol (VITAMIN D PO) Take by mouth once a week.    [provider]  Evolocumab  (REPATHA  SURECLICK) 140 MG/ML SOAJ ADMINISTER 1 ML UNDER THE SKIN EVERY 14 DAYS 06/20/23   [name], [name], MD  lisinopril  (ZESTRIL ) 20 MG tablet Take 1 tablet (20 mg total) by mouth daily. 04/08/22 04/03/23  [name], [name], MD  Magnesium 250 MG TABS Take by mouth daily.    [provider]  Semaglutide , 1 MG/DOSE, 4 MG/3ML SOPN  Inject 1 mg as directed once a week. 03/04/23   [name], MD  tadalafil  (CIALIS ) 5 MG tablet TAKE 1 TABLET BY MOUTH DAILY AS NEEDED FOR ERECTILE DYSFUNCTION 09/21/20   [name], MD    Family History    Family History  Problem Relation Age of Onset   Prostate cancer Father    Heart attack Father 23       smoker   Cancer Father        LUNG   Diabetes Mother    CAD Mother    Thyroid disease Sister    Heart attack Maternal Grandfather 108   Heart attack Paternal Grandfather 42   He indicated that his mother is alive. He indicated that his father is deceased. He indicated that his sister is alive. He indicated that his brother is alive. He indicated that his maternal grandmother is deceased. He indicated that his maternal grandfather is deceased. He indicated that his paternal grandmother is deceased. He indicated that his paternal grandfather is deceased.  Social History    Social History   Socioeconomic History   Marital status: Married    Spouse name: Not on file   Number of children: 2   Years of education: Not on file   Highest education level: Not on file  Occupational History   Occupation: sales- equipment  Tobacco Use   Smoking status: Former    Current packs/day: 0.00    Average packs/day: 2.0 packs/day for 34.0 years (68.0 ttl pk-yrs)    Types: Cigarettes    Start date: 07/22/1973    Quit date: 07/23/2007    Years since quitting: 15.9   Smokeless tobacco: Never  Substance and Sexual Activity   Alcohol use: Yes    Alcohol/week: 7.0 standard drinks of alcohol    Types: 7 Glasses of wine per week   Drug use: No   Sexual activity: Not on file  Other Topics Concern   Not on file  Social History Narrative   Not on file   Social Drivers of Health   Financial Resource Strain: Not on file  Food Insecurity: Not on file  Transportation Needs: Not on file  Physical Activity: Not on file  Stress: Not on file  Social Connections: Not on file   Intimate Partner Violence: Not on file     Review of Systems    General:  No chills, fever, night sweats or weight changes.  Cardiovascular:  No chest pain, dyspnea on exertion, edema, orthopnea, palpitations, paroxysmal nocturnal dyspnea. Dermatological: No rash, lesions/masses Respiratory: No cough, dyspnea Urologic: No hematuria, dysuria Abdominal:   No nausea, vomiting, diarrhea, bright red blood per rectum, melena, or hematemesis Neurologic:  No visual changes, wkns, changes in mental status. All other systems reviewed and are otherwise negative except as noted above.  Physical Exam    VS:  BP ROLLEN)  142/86   Pulse 63   Ht 6' 3 (1.905 m)   Wt (!) 330 lb 3.2 oz (149.8 kg)   SpO2 96%   BMI 41.27 kg/m  , BMI Body mass index is 41.27 kg/m. GEN: Well nourished, well developed, in no acute distress. HEENT: normal. Neck: Supple, no JVD, carotid bruits, or masses. Cardiac: RRR, no murmurs, rubs, or gallops. No clubbing, cyanosis, edema.  Radials/DP/PT 2+ and equal bilaterally.  Respiratory:  Respirations regular and unlabored, clear to auscultation bilaterally. GI: Soft, nontender, nondistended, BS + x 4. MS: no deformity or atrophy. Skin: warm and dry, no rash. Neuro:  Strength and sensation are intact. Psych: Normal affect.  Accessory Clinical Findings    Recent Labs: No results found for requested labs within last 365 days.   Recent Lipid Panel    Component Value Date/Time   CHOL 125 06/12/2022 0904   TRIG 102 06/12/2022 0904   HDL 46 06/12/2022 0904   CHOLHDL 2.7 06/12/2022 0904   CHOLHDL 6 07/10/2021 1024   VLDL 27.6 07/10/2021 1024   LDLCALC 60 06/12/2022 0904   LDLDIRECT 188.0 10/02/2009 0836    HYPERTENSION CONTROL Vitals:   07/18/23 0929 07/18/23 1000  BP: (!) 152/102 (!) 142/86    The patient's blood pressure is elevated above target today.  In order to address the patient's elevated BP: Blood pressure will be monitored at home to determine if  medication changes need to be made.; A new medication was prescribed today.; A current anti-hypertensive medication was adjusted today.       ECG personally reviewed by me today- EKG Interpretation Date/Time:  Friday July 18 2023 09:34:58 EST Ventricular Rate:  63 PR Interval:  156 QRS Duration:  110 QT Interval:  386 QTC Calculation: 395 R Axis:   84  Text Interpretation: Normal sinus rhythm Normal ECG No previous ECGs available Confirmed by Emelia Hazy [phone]) on 07/18/2023 9:40:45 AM   Echocardiogram 12/18/2020 IMPRESSIONS     1. Left ventricular ejection fraction, by estimation, is 65 to 70%. The  left ventricle has normal function. The left ventricle has no regional  wall motion abnormalities. Left ventricular diastolic parameters were  normal.   2. Right ventricular systolic function is normal. The right ventricular  size is normal. There is normal pulmonary artery systolic pressure.   3. The mitral valve is normal in structure. No evidence of mitral valve  regurgitation.   4. The aortic valve is normal in structure. Aortic valve regurgitation is  not visualized.   FINDINGS   Left Ventricle: Left ventricular ejection fraction, by estimation, is 65  to 70%. The left ventricle has normal function. The left ventricle has no  regional wall motion abnormalities. The left ventricular internal cavity  size was small. There is no left  ventricular hypertrophy. Left ventricular diastolic parameters were  normal.   Right Ventricle: The right ventricular size is normal. Right vetricular  wall thickness was not well visualized. Right ventricular systolic  function is normal. There is normal pulmonary artery systolic pressure.  The tricuspid regurgitant velocity is 2.17  m/s, and with an assumed right atrial pressure of 3 mmHg, the estimated  right ventricular systolic pressure is 21.8 mmHg.   Left Atrium: Left atrial size was normal in size.   Right Atrium: Right atrial  size was normal in size.   Pericardium: There is no evidence of pericardial effusion.   Mitral Valve: The mitral valve is normal in structure. No evidence of  mitral valve  regurgitation.   Tricuspid Valve: The tricuspid valve is grossly normal. Tricuspid valve  regurgitation is trivial.   Aortic Valve: The aortic valve is normal in structure. Aortic valve  regurgitation is not visualized. Aortic valve mean gradient measures 7.0  mmHg. Aortic valve peak gradient measures 15.6 mmHg. Aortic valve area, by  VTI measures 3.01 cm.   Pulmonic Valve: The pulmonic valve was not well visualized. Pulmonic valve  regurgitation is not visualized.   Aorta: The aortic root and ascending aorta are structurally normal, with  no evidence of dilitation.   IAS/Shunts: The atrial septum is grossly normal.    Nuclear stress test 11/24/2020   The left ventricular ejection fraction is mildly decreased (45-54%). Nuclear stress EF: 50%. No T wave inversion was noted during stress. There was no ST segment deviation noted during stress. This is a low risk study.   No reversible ischemia. TID ratio is not elevated. LVEF 50% with normal wall motion. This is a low risk study. No prior for comparison.   Assessment & Plan   1.  Coronary artery disease-no chest pain today.  Denies anginal symptoms.  Continues to be fairly sedentary.   Reports compliance with Repatha . Continue current medical therapy Heart healthy low-sodium diet-salty 6 diet sheet given Increase physical activity  Hyperlipidemia-LDL 60 on 06/12/2022.  Goal less than 70 statin intolerant. Continue Repatha -refill Repeat fasting lipids and LFTs-in 1 week High-fiber diet  Essential hypertension-BP today 142/86. Stopped taking lisinopril  Maintain blood pressure log Continue  amlodipine  Start HCTZ 12.5 mg daily Heart healthy low-sodium diet Order BMP in 1 week  Type 2 diabetes-glucose 94 on 06/12/2022. Continue semaglutide  Follows  with PCP  Disposition: Follow-up with Dr. [name] or me in 9-12 months.   Josefa HERO. Manha Amato NP-C     07/18/2023, 10:01 AM [facility] [address] Office [phone] Fax [phone]    I spent 14 minutes examining this patient, reviewing medications, and using patient centered shared decision making involving their cardiac care.   I spent greater than 20 minutes reviewing their past medical history,  medications, and prior cardiac tests.

## 2023-07-18 ENCOUNTER — Encounter: Payer: Self-pay | Admitting: General Practice

## 2023-07-18 ENCOUNTER — Ambulatory Visit: Payer: BC Managed Care – PPO | Attending: General Practice | Admitting: General Practice

## 2023-07-18 VITALS — BP 142/86 | HR 63 | Ht 75.0 in | Wt 330.2 lb

## 2023-07-18 DIAGNOSIS — I25118 Atherosclerotic heart disease of native coronary artery with other forms of angina pectoris: Secondary | ICD-10-CM

## 2023-07-18 DIAGNOSIS — E1165 Type 2 diabetes mellitus with hyperglycemia: Secondary | ICD-10-CM | POA: Diagnosis not present

## 2023-07-18 DIAGNOSIS — E785 Hyperlipidemia, unspecified: Secondary | ICD-10-CM | POA: Diagnosis not present

## 2023-07-18 DIAGNOSIS — I10 Essential (primary) hypertension: Secondary | ICD-10-CM

## 2023-07-18 MED ORDER — HYDROCHLOROTHIAZIDE 12.5 MG PO CAPS: 12.5000 mg | ORAL_CAPSULE | Freq: Every day | ORAL | 3 refills | Status: DC

## 2023-07-18 MED ORDER — REPATHA SURECLICK 140 MG/ML ~~LOC~~ SOAJ: 140.0000 mg | SUBCUTANEOUS | 3 refills | Status: AC

## 2023-07-18 NOTE — Patient Instructions (Signed)
 Medication Instructions:  Your physician has recommended you make the following change in your medication:   -Start hydrochlorothiazide  (microzide ) 12.5mg  once daily.  *If you need a refill on your cardiac medications before your next appointment, please call your pharmacy*   Lab Work: Your physician recommends that you return for lab work in: 2 weeks for FASTING CMET & lipids  If you have labs (blood work) drawn today and your tests are completely normal, you will receive your results only by: MyChart Message (if you have MyChart) OR A paper copy in the mail If you have any lab test that is abnormal or we need to change your treatment, we will call you to review the results.    Follow-Up: At [facility], you and your health needs are our priority.  As part of our continuing mission to provide you with exceptional heart care, we have created designated Provider Care Teams.  These Care Teams include your primary Cardiologist (physician) and Advanced Practice Providers (APPs -  Physician Assistants and Nurse Practitioners) who all work together to provide you with the care you need, when you need it.  We recommend signing up for the patient portal called MyChart.  Sign up information is provided on this After Visit Summary.  MyChart is used to connect with patients for Virtual Visits (Telemedicine).  Patients are able to view lab/test results, encounter notes, upcoming appointments, etc.  Non-urgent messages can be sent to your provider as well.   To learn more about what you can do with MyChart, go to [email or web address].    Your next appointment:   9-12 month(s)  Provider:   [name], MD  or Josefa Beauvais, FNP       Other Instructions Please increase your physical activity as tolerated.  [facility] [place]:

## 2023-08-07 ENCOUNTER — Other Ambulatory Visit (HOSPITAL_COMMUNITY): Payer: Self-pay

## 2023-08-07 ENCOUNTER — Telehealth: Payer: Self-pay | Admitting: Pharmacy Technician

## 2023-08-07 NOTE — Telephone Encounter (Signed)
Pharmacy Patient Advocate Encounter  Received notification from [facility]  that Prior Authorization for Repatha has been APPROVED from 07/08/23 to 08/06/24   PA #/Case ID/Reference #: [id number]

## 2023-08-07 NOTE — Telephone Encounter (Signed)
Pharmacy Patient Advocate Encounter   Received notification from CoverMyMeds that prior authorization for repatha is required/requested.   Insurance verification completed.   The patient is insured through [employer]  .   Per test claim: PA required; PA submitted to above mentioned insurance via CoverMyMeds Key/confirmation #/EOC [id number] Status is pending

## 2023-08-12 LAB — LIPID PANEL
Chol/HDL Ratio: 3.5 {ratio} (ref 0.0–5.0)
Cholesterol, Total: 153 mg/dL (ref 100–199)
HDL: 44 mg/dL (ref >39)
LDL Chol Calc (NIH): 80 mg/dL (ref 0–99)
Triglycerides: 171 mg/dL — ABNORMAL HIGH (ref 0–149)
VLDL Cholesterol Cal: 29 mg/dL (ref 5–40)

## 2023-08-12 LAB — COMPREHENSIVE METABOLIC PANEL
ALT: 59 [IU]/L — ABNORMAL HIGH (ref 0–44)
AST: 29 [IU]/L (ref 0–40)
Albumin: 4.5 g/dL (ref 3.9–4.9)
Alkaline Phosphatase: 66 [IU]/L (ref 44–121)
BUN/Creatinine Ratio: 15 (ref 10–24)
BUN: 15 mg/dL (ref 8–27)
Bilirubin Total: 0.9 mg/dL (ref 0.0–1.2)
CO2: 26 mmol/L (ref 20–29)
Calcium: 9.5 mg/dL (ref 8.6–10.2)
Chloride: 99 mmol/L (ref 96–106)
Creatinine, Ser: 0.98 mg/dL (ref 0.76–1.27)
Globulin, Total: 2.4 g/dL (ref 1.5–4.5)
Glucose: 116 mg/dL — ABNORMAL HIGH (ref 70–99)
Potassium: 4.8 mmol/L (ref 3.5–5.2)
Sodium: 141 mmol/L (ref 134–144)
Total Protein: 6.9 g/dL (ref 6.0–8.5)
eGFR: 88 mL/min/{1.73_m2} (ref >59)

## 2023-09-05 ENCOUNTER — Encounter (INDEPENDENT_AMBULATORY_CARE_PROVIDER_SITE_OTHER): Payer: Self-pay

## 2023-09-05 DIAGNOSIS — I10 Essential (primary) hypertension: Secondary | ICD-10-CM | POA: Diagnosis not present

## 2023-09-05 MED ORDER — HYDROCHLOROTHIAZIDE 25 MG PO TABS: 25.0000 mg | ORAL_TABLET | Freq: Every day | ORAL | 6 refills | Status: DC

## 2023-09-05 NOTE — Telephone Encounter (Signed)
 Please contact Mr. Lumpkin and let him know that I reviewed his message.  We will increase his HCTZ to 25 mg daily.  Please have him continue to monitor his blood pressure.  We will repeat a BMP in 1-2 weeks.  Please ask him to continue to follow a heart healthy low-sodium diet.  Thank you.  Thomasene Ripple. Latissa Frick NP-C     09/05/2023, 10:41 AM [facility] [address] Office [phone] Fax [phone]   I spent 5 minutes reviewing patient's question, medications, and past medical history.

## 2023-09-15 MED ORDER — AMLODIPINE BESYLATE 10 MG PO TABS: 10.0000 mg | ORAL_TABLET | Freq: Every day | ORAL | 3 refills | Status: DC

## 2023-09-20 LAB — BASIC METABOLIC PANEL
BUN/Creatinine Ratio: 14 (ref 10–24)
BUN: 14 mg/dL (ref 8–27)
CO2: 28 mmol/L (ref 20–29)
Calcium: 9.5 mg/dL (ref 8.6–10.2)
Chloride: 100 mmol/L (ref 96–106)
Creatinine, Ser: 0.99 mg/dL (ref 0.76–1.27)
Glucose: 122 mg/dL — ABNORMAL HIGH (ref 70–99)
Potassium: 4.7 mmol/L (ref 3.5–5.2)
Sodium: 141 mmol/L (ref 134–144)
eGFR: 86 mL/min/{1.73_m2} (ref >59)

## 2023-09-29 ENCOUNTER — Other Ambulatory Visit: Payer: Self-pay

## 2023-09-29 MED ORDER — HYDROCHLOROTHIAZIDE 25 MG PO TABS: 25.0000 mg | ORAL_TABLET | Freq: Every day | ORAL | 3 refills | Status: AC

## 2023-09-29 NOTE — Progress Notes (Signed)
 Refill sent to pharmacy.

## 2023-11-17 ENCOUNTER — Other Ambulatory Visit: Payer: Self-pay | Admitting: Emergency Medicine

## 2023-11-18 ENCOUNTER — Encounter: Payer: Self-pay | Admitting: Emergency Medicine

## 2023-11-19 ENCOUNTER — Other Ambulatory Visit: Payer: Self-pay | Admitting: Emergency Medicine

## 2023-11-19 MED ORDER — TIRZEPATIDE 5 MG/0.5ML ~~LOC~~ SOAJ: 5.0000 mg | SUBCUTANEOUS | 3 refills | Status: DC

## 2023-11-19 NOTE — Telephone Encounter (Signed)
 New prescription for Mounjaro sent to pharmacy of record today.

## 2023-12-23 ENCOUNTER — Encounter: Payer: Self-pay | Admitting: Emergency Medicine

## 2023-12-23 NOTE — Telephone Encounter (Signed)
 Advise about what?  Please clarify.

## 2023-12-23 NOTE — Telephone Encounter (Signed)
 Okay to send prescription for Mounjaro  10 mg.  Thanks.

## 2024-01-08 ENCOUNTER — Other Ambulatory Visit: Payer: Self-pay | Admitting: Radiology

## 2024-01-08 MED ORDER — TIRZEPATIDE 10 MG/0.5ML ~~LOC~~ SOAJ: 10.0000 mg | SUBCUTANEOUS | 0 refills | Status: AC

## 2024-01-22 DIAGNOSIS — I10 Essential (primary) hypertension: Secondary | ICD-10-CM

## 2024-01-22 MED ORDER — VALSARTAN 160 MG PO TABS: 160.0000 mg | ORAL_TABLET | Freq: Every day | ORAL | 2 refills | Status: DC

## 2024-01-22 NOTE — Telephone Encounter (Signed)
 I would recommend starting valsartan  160 mg daily in addition to HCT   Peter [place] MD, FACC

## 2024-01-22 NOTE — Addendum Note (Signed)
 Addended by: DIAMANTINA HARBOUR R on: 01/22/2024 02:10 PM   Modules accepted: Orders

## 2024-02-04 ENCOUNTER — Telehealth: Payer: Self-pay

## 2024-02-04 NOTE — Telephone Encounter (Signed)
 Copied from CRM [id number]. Topic: Clinical - Medication Question >> Feb 04, 2024 10:11 AM [place] E wrote: Reason for CRM: Robin from [facility] called need clarification for which dosage of the tirzepatide  (MOUNJARO ) patient is currently on. Grayce stated the 10mg  was just filled on the July 15th, but he put in a request to fill the 5mg  dosage. Grayce would like to know if there was an issue with the patient being on 10mg . Callback number [phone].

## 2024-02-04 NOTE — Telephone Encounter (Signed)
 Per patients chart, Dr.Sagardia approved 10mg  dose on 06/17. Spoke with Grayce at pharmacy to clarify patients dose

## 2024-02-05 ENCOUNTER — Encounter: Payer: Self-pay | Admitting: Emergency Medicine

## 2024-02-06 NOTE — Telephone Encounter (Signed)
 Needs OV.

## 2024-02-12 ENCOUNTER — Encounter: Payer: Self-pay | Admitting: Emergency Medicine

## 2024-02-12 ENCOUNTER — Ambulatory Visit (INDEPENDENT_AMBULATORY_CARE_PROVIDER_SITE_OTHER): Admitting: Emergency Medicine

## 2024-02-12 VITALS — BP 150/80 | HR 66 | Temp 99.2°F | Ht 75.0 in | Wt 332.0 lb

## 2024-02-12 DIAGNOSIS — I152 Hypertension secondary to endocrine disorders: Secondary | ICD-10-CM

## 2024-02-12 DIAGNOSIS — I700 Atherosclerosis of aorta: Secondary | ICD-10-CM | POA: Diagnosis not present

## 2024-02-12 DIAGNOSIS — E1159 Type 2 diabetes mellitus with other circulatory complications: Secondary | ICD-10-CM | POA: Diagnosis not present

## 2024-02-12 DIAGNOSIS — I2510 Atherosclerotic heart disease of native coronary artery without angina pectoris: Secondary | ICD-10-CM | POA: Diagnosis not present

## 2024-02-12 DIAGNOSIS — Z122 Encounter for screening for malignant neoplasm of respiratory organs: Secondary | ICD-10-CM | POA: Diagnosis not present

## 2024-02-12 DIAGNOSIS — J432 Centrilobular emphysema: Secondary | ICD-10-CM

## 2024-02-12 DIAGNOSIS — G4733 Obstructive sleep apnea (adult) (pediatric): Secondary | ICD-10-CM

## 2024-02-12 LAB — CBC WITH DIFFERENTIAL/PLATELET
Basophils Absolute: 0 K/uL (ref 0.0–0.1)
Basophils Relative: 0.5 % (ref 0.0–3.0)
Eosinophils Absolute: 0.3 K/uL (ref 0.0–0.7)
Eosinophils Relative: 6 % — ABNORMAL HIGH (ref 0.0–5.0)
HCT: 51.9 % (ref 39.0–52.0)
Hemoglobin: 17.5 g/dL — ABNORMAL HIGH (ref 13.0–17.0)
Lymphocytes Relative: 29.4 % (ref 12.0–46.0)
Lymphs Abs: 1.6 K/uL (ref 0.7–4.0)
MCHC: 33.7 g/dL (ref 30.0–36.0)
MCV: 92.7 fl (ref 78.0–100.0)
Monocytes Absolute: 0.5 K/uL (ref 0.1–1.0)
Monocytes Relative: 8.1 % (ref 3.0–12.0)
Neutro Abs: 3.1 K/uL (ref 1.4–7.7)
Neutrophils Relative %: 56 % (ref 43.0–77.0)
Platelets: 285 K/uL (ref 150.0–400.0)
RBC: 5.59 Mil/uL (ref 4.22–5.81)
RDW: 14 % (ref 11.5–15.5)
WBC: 5.6 K/uL (ref 4.0–10.5)

## 2024-02-12 LAB — HEMOGLOBIN A1C: Hgb A1c MFr Bld: 6.6 % — ABNORMAL HIGH (ref 4.6–6.5)

## 2024-02-12 LAB — PSA: PSA: 1.14 ng/mL (ref 0.10–4.00)

## 2024-02-12 NOTE — Patient Instructions (Signed)
 Health Maintenance, Male  Adopting a healthy lifestyle and getting preventive care are important in promoting health and wellness. Ask your health care provider about:  The right schedule for you to have regular tests and exams.  Things you can do on your own to prevent diseases and keep yourself healthy.  What should I know about diet, weight, and exercise?  Eat a healthy diet    Eat a diet that includes plenty of vegetables, fruits, low-fat dairy products, and lean protein.  Do not eat a lot of foods that are high in solid fats, added sugars, or sodium.  Maintain a healthy weight  Body mass index (BMI) is a measurement that can be used to identify possible weight problems. It estimates body fat based on height and weight. Your health care provider can help determine your BMI and help you achieve or maintain a healthy weight.  Get regular exercise  Get regular exercise. This is one of the most important things you can do for your health. Most adults should:  Exercise for at least 150 minutes each week. The exercise should increase your heart rate and make you sweat (moderate-intensity exercise).  Do strengthening exercises at least twice a week. This is in addition to the moderate-intensity exercise.  Spend less time sitting. Even light physical activity can be beneficial.  Watch cholesterol and blood lipids  Have your blood tested for lipids and cholesterol at 62 years of age, then have this test every 5 years.  You may need to have your cholesterol levels checked more often if:  Your lipid or cholesterol levels are high.  You are older than 61 years of age.  You are at high risk for heart disease.  What should I know about cancer screening?  Many types of cancers can be detected early and may often be prevented. Depending on your health history and family history, you may need to have cancer screening at various ages. This may include screening for:  Colorectal cancer.  Prostate cancer.  Skin cancer.  Lung  cancer.  What should I know about heart disease, diabetes, and high blood pressure?  Blood pressure and heart disease  High blood pressure causes heart disease and increases the risk of stroke. This is more likely to develop in people who have high blood pressure readings or are overweight.  Talk with your health care provider about your target blood pressure readings.  Have your blood pressure checked:  Every 3-5 years if you are 9-95 years of age.  Every year if you are 85 years old or older.  If you are between the ages of 29 and 29 and are a current or former smoker, ask your health care provider if you should have a one-time screening for abdominal aortic aneurysm (AAA).  Diabetes  Have regular diabetes screenings. This checks your fasting blood sugar level. Have the screening done:  Once every three years after age 23 if you are at a normal weight and have a low risk for diabetes.  More often and at a younger age if you are overweight or have a high risk for diabetes.  What should I know about preventing infection?  Hepatitis B  If you have a higher risk for hepatitis B, you should be screened for this virus. Talk with your health care provider to find out if you are at risk for hepatitis B infection.  Hepatitis C  Blood testing is recommended for:  Everyone born from [birth year] through 1964.  Anyone  with known risk factors for hepatitis C.  Sexually transmitted infections (STIs)  You should be screened each year for STIs, including gonorrhea and chlamydia, if:  You are sexually active and are younger than 62 years of age.  You are older than 62 years of age and your health care provider tells you that you are at risk for this type of infection.  Your sexual activity has changed since you were last screened, and you are at increased risk for chlamydia or gonorrhea. Ask your health care provider if you are at risk.  Ask your health care provider about whether you are at high risk for HIV. Your health care provider  may recommend a prescription medicine to help prevent HIV infection. If you choose to take medicine to prevent HIV, you should first get tested for HIV. You should then be tested every 3 months for as long as you are taking the medicine.  Follow these instructions at home:  Alcohol use  Do not drink alcohol if your health care provider tells you not to drink.  If you drink alcohol:  Limit how much you have to 0-2 drinks a day.  Know how much alcohol is in your drink. In the [place] one drink equals one 12 oz bottle of beer (355 mL), one 5 oz glass of wine (148 mL), or one 1 oz glass of hard liquor (44 mL).  Lifestyle  Do not use any products that contain nicotine or tobacco. These products include cigarettes, chewing tobacco, and vaping devices, such as e-cigarettes. If you need help quitting, ask your health care provider.  Do not use street drugs.  Do not share needles.  Ask your health care provider for help if you need support or information about quitting drugs.  General instructions  Schedule regular health, dental, and eye exams.  Stay current with your vaccines.  Tell your health care provider if:  You often feel depressed.  You have ever been abused or do not feel safe at home.  Summary  Adopting a healthy lifestyle and getting preventive care are important in promoting health and wellness.  Follow your health care provider's instructions about healthy diet, exercising, and getting tested or screened for diseases.  Follow your health care provider's instructions on monitoring your cholesterol and blood pressure.  This information is not intended to replace advice given to you by your health care provider. Make sure you discuss any questions you have with your health care provider.  Document Revised: 11/13/2020 Document Reviewed: 11/13/2020  Elsevier Patient Education  2024 [employer].

## 2024-02-12 NOTE — Assessment & Plan Note (Signed)
Presently asymptomatic.  Not using Breztri inhaler.

## 2024-02-12 NOTE — Assessment & Plan Note (Signed)
Using CPAP intermittently

## 2024-02-12 NOTE — Assessment & Plan Note (Signed)
 BP Readings from Last 3 Encounters:  02/12/24 (!) 150/80  07/18/23 (!) 142/86  02/26/23 138/88  Elevated blood pressure reading in the office and similar numbers at home Will suggest started on valsartan  160 mg daily 2 weeks ago Amlodipine  was discontinued due to leg edema Continues hydrochlorothiazide  25 mg daily Will continue monitoring blood pressure readings at home daily for the next several weeks.  Advised to contact the office if numbers persistently abnormal.  May need to increase valsartan  to 320 mg daily Eating better and exercising more Hemoglobin A1c done today Continues Mounjaro  10 mg weekly Diet and nutrition discussed

## 2024-02-12 NOTE — Assessment & Plan Note (Signed)
Diet and nutrition discussed Presently on Repatha

## 2024-02-12 NOTE — Assessment & Plan Note (Signed)
 Diet and nutrition discussed. Advised to decrease amount of daily carbohydrate intake and daily calories and increase amount of plant-based protein in his diet Recommend to continue Mounjaro  at 10 mg weekly May need to increase dose to 12.5 mg weekly at next refill time

## 2024-02-12 NOTE — Assessment & Plan Note (Signed)
Clinically stable.  No anginal episodes.  Continues daily baby aspirin

## 2024-02-12 NOTE — Progress Notes (Signed)
 Jose Bennett 63 y.o.   Chief Complaint  Patient presents with   Annual Exam    Patient here for physical. Patient is needing to have his yearly lung xray. Patient states he has not loss any weight with the 10mg  mounjaro . Also says his bp at home is still high he states the cardiologist took him off amlodipine  and is only HYDRODIURIL  for bp    HISTORY OF PRESENT ILLNESS: This is a 63 y.o. male A1A here for follow-up of multiple chronic medical conditions Recently taken off amlodipine  due to leg swelling and started on valsartan  160 mg daily along with hydrochlorothiazide  Overall doing well. Due for lung cancer screening with chest CT No other complaints or medical concerns today.  HPI   Prior to Admission medications   Medication Sig Start Date End Date Taking? Authorizing Provider  Ascorbic Acid (VITAMIN C) 1000 MG tablet Take 1,000 mg by mouth daily.   Yes [provider]  aspirin EC 81 MG tablet Take 81 mg by mouth daily.   Yes [provider]  Budeson-Glycopyrrol-Formoterol (BREZTRI  AEROSPHERE) 160-9-4.8 MCG/ACT AERO Inhale 2 puffs into the lungs 2 (two) times daily. 04/07/23  Yes [name], MD  CALCIUM  PO Take by mouth daily.   Yes [provider]  Cholecalciferol (VITAMIN D PO) Take by mouth once a week.   Yes [provider]  Evolocumab  (REPATHA  SURECLICK) 140 MG/ML SOAJ Inject 140 mg into the skin every 14 (fourteen) days. 07/18/23  Yes Emelia Josefa HERO, NP  hydrochlorothiazide  (HYDRODIURIL ) 25 MG tablet Take 1 tablet (25 mg total) by mouth daily. 09/29/23 02/12/24 Yes Cleaver, Josefa HERO, NP  Magnesium 250 MG TABS Take by mouth daily.   Yes [provider]  tirzepatide  (MOUNJARO ) 10 MG/0.5ML Pen Inject 10 mg into the skin once a week. 01/08/24  Yes Orvell Careaga, Emil Schanz, MD  valsartan  (DIOVAN ) 160 MG tablet Take 1 tablet (160 mg total) by mouth daily. 01/22/24  Yes [place], [name], MD  amLODipine  (NORVASC ) 10 MG tablet Take 1  tablet (10 mg total) by mouth daily. Patient not taking: Reported on 02/12/2024 09/15/23   [place], [name], MD  tirzepatide  (MOUNJARO ) 5 MG/0.5ML Pen Inject 5 mg into the skin once a week. 11/19/23   [name], MD    No Known Allergies  Patient Active Problem List   Diagnosis Date Noted   Statin myopathy 02/26/2023   Obstructive sleep apnea 02/26/2023   Type 2 diabetes mellitus with hyperglycemia, without long-term current use of insulin (HCC) 07/10/2021   Atherosclerosis of native coronary artery of native heart without angina pectoris 09/19/2020   Centrilobular emphysema (HCC) 09/19/2020   Hepatic steatosis 09/19/2020   Hypertension associated with diabetes (HCC) 09/19/2020   Morbid obesity (HCC) 09/19/2020   Aortic atherosclerosis (HCC) 09/19/2020   ABNORMAL INVOLUNTARY MOVEMENTS 09/26/2009   ADD 10/05/2007   Dyslipidemia 03/23/2007    Past Medical History:  Diagnosis Date   Hyperlipidemia     Past Surgical History:  Procedure Laterality Date   BRAIN SURGERY     KNEE ARTHROSCOPY     x2    Social History   Socioeconomic History   Marital status: Married    Spouse name: Not on file   Number of children: 2   Years of education: Not on file   Highest education level: 12th grade  Occupational History   Occupation: Airline pilot- equipment  Tobacco Use   Smoking status: Former    Current packs/day: 0.00  Average packs/day: 2.0 packs/day for 34.0 years (68.0 ttl pk-yrs)    Types: Cigarettes    Start date: 07/22/1973    Quit date: 07/23/2007    Years since quitting: 16.5   Smokeless tobacco: Never  Substance and Sexual Activity   Alcohol use: Yes    Alcohol/week: 7.0 standard drinks of alcohol    Types: 7 Glasses of wine per week   Drug use: No   Sexual activity: Not on file  Other Topics Concern   Not on file  Social History Narrative   Not on file   Social Drivers of Health   Financial Resource Strain: Low Risk  (02/11/2024)   Overall Financial Resource  Strain (CARDIA)    Difficulty of Paying Living Expenses: Not hard at all  Food Insecurity: No Food Insecurity (02/11/2024)   Hunger Vital Sign    Worried About Running Out of Food in the Last Year: Never true    Ran Out of Food in the Last Year: Never true  Transportation Needs: No Transportation Needs (02/11/2024)   PRAPARE - Administrator, Civil Service (Medical): No    Lack of Transportation (Non-Medical): No  Physical Activity: Sufficiently Active (02/11/2024)   Exercise Vital Sign    Days of Exercise per Week: 6 days    Minutes of Exercise per Session: 30 min  Stress: No Stress Concern Present (02/11/2024)   [employer] of Occupational Health - Occupational Stress Questionnaire    Feeling of Stress: Not at all  Social Connections: Moderately Integrated (02/11/2024)   Social Connection and Isolation Panel    Frequency of Communication with Friends and Family: Three times a week    Frequency of Social Gatherings with Friends and Family: Twice a week    Attends Religious Services: 1 to 4 times per year    Active Member of [employer] or Organizations: No    Attends Engineer, structural: Not on file    Marital Status: Married  Catering manager Violence: Not on file    Family History  Problem Relation Age of Onset   Prostate cancer Father    Heart attack Father 98       smoker   Cancer Father        LUNG   Diabetes Mother    CAD Mother    Thyroid disease Sister    Heart attack Maternal Grandfather 76   Heart attack Paternal Grandfather 72     Review of Systems  Constitutional: Negative.  Negative for chills and fever.  HENT: Negative.  Negative for congestion and sore throat.   Respiratory: Negative.  Negative for cough and shortness of breath.   Cardiovascular: Negative.  Negative for chest pain and palpitations.  Gastrointestinal:  Negative for abdominal pain, diarrhea, nausea and vomiting.  Genitourinary: Negative.  Negative for dysuria and hematuria.   Skin: Negative.  Negative for rash.  Neurological: Negative.  Negative for dizziness and headaches.  All other systems reviewed and are negative.   Vitals:   02/12/24 1307  BP: (!) 150/80  Pulse: 66  Temp: 99.2 F (37.3 C)  SpO2: 94%    Physical Exam Vitals reviewed.  Constitutional:      Appearance: He is obese.  HENT:     Head: Normocephalic.     Mouth/Throat:     Mouth: Mucous membranes are moist.     Pharynx: Oropharynx is clear.  Eyes:     Extraocular Movements: Extraocular movements intact.     Conjunctiva/sclera: Conjunctivae  normal.     Pupils: Pupils are equal, round, and reactive to light.  Cardiovascular:     Rate and Rhythm: Normal rate and regular rhythm.     Pulses: Normal pulses.     Heart sounds: Normal heart sounds.  Pulmonary:     Effort: Pulmonary effort is normal.     Breath sounds: Normal breath sounds.  Abdominal:     Palpations: Abdomen is soft.     Tenderness: There is no abdominal tenderness.  Musculoskeletal:     Cervical back: No tenderness.  Lymphadenopathy:     Cervical: No cervical adenopathy.  Skin:    General: Skin is warm and dry.     Capillary Refill: Capillary refill takes less than 2 seconds.  Neurological:     General: No focal deficit present.     Mental Status: He is alert and oriented to person, place, and time.  Psychiatric:        Mood and Affect: Mood normal.        Behavior: Behavior normal.      ASSESSMENT & PLAN: A total of 45 minutes was spent with the patient and counseling/coordination of care regarding preparing for this visit, review of most recent office visit notes, review of multiple chronic medical conditions and their management, review of all medications, review of most recent bloodwork results, review of health maintenance items, education on nutrition, prognosis, documentation, and need for follow up.  Problem List Items Addressed This Visit       Cardiovascular and Mediastinum   Atherosclerosis  of native coronary artery of native heart without angina pectoris   Clinically stable.  No anginal episodes.  Continues daily baby aspirin      Hypertension associated with diabetes (HCC) - Primary   BP Readings from Last 3 Encounters:  02/12/24 (!) 150/80  07/18/23 (!) 142/86  02/26/23 138/88  Elevated blood pressure reading in the office and similar numbers at home Will suggest started on valsartan  160 mg daily 2 weeks ago Amlodipine  was discontinued due to leg edema Continues hydrochlorothiazide  25 mg daily Will continue monitoring blood pressure readings at home daily for the next several weeks.  Advised to contact the office if numbers persistently abnormal.  May need to increase valsartan  to 320 mg daily Eating better and exercising more Hemoglobin A1c done today Continues Mounjaro  10 mg weekly Diet and nutrition discussed       Relevant Orders   CBC with Differential/Platelet   Hemoglobin A1c   Comprehensive metabolic panel with GFR   Lipid panel   Aortic atherosclerosis (HCC)   Diet and nutrition discussed Presently on Repatha         Respiratory   Centrilobular emphysema (HCC)   Presently asymptomatic. Not using Breztri  inhaler       Obstructive sleep apnea   Using CPAP intermittently        Other   Morbid obesity (HCC)   Diet and nutrition discussed. Advised to decrease amount of daily carbohydrate intake and daily calories and increase amount of plant-based protein in his diet Recommend to continue Mounjaro  at 10 mg weekly May need to increase dose to 12.5 mg weekly at next refill time      Other Visit Diagnoses       Screening for lung cancer       Relevant Orders   CT CHEST LUNG CA SCREEN LOW DOSE W/O CM   PSA      Patient Instructions  Health Maintenance, Male Adopting a healthy  lifestyle and getting preventive care are important in promoting health and wellness. Ask your health care provider about: The right schedule for you to have regular  tests and exams. Things you can do on your own to prevent diseases and keep yourself healthy. What should I know about diet, weight, and exercise? Eat a healthy diet  Eat a diet that includes plenty of vegetables, fruits, low-fat dairy products, and lean protein. Do not eat a lot of foods that are high in solid fats, added sugars, or sodium. Maintain a healthy weight Body mass index (BMI) is a measurement that can be used to identify possible weight problems. It estimates body fat based on height and weight. Your health care provider can help determine your BMI and help you achieve or maintain a healthy weight. Get regular exercise Get regular exercise. This is one of the most important things you can do for your health. Most adults should: Exercise for at least 150 minutes each week. The exercise should increase your heart rate and make you sweat (moderate-intensity exercise). Do strengthening exercises at least twice a week. This is in addition to the moderate-intensity exercise. Spend less time sitting. Even light physical activity can be beneficial. Watch cholesterol and blood lipids Have your blood tested for lipids and cholesterol at 62 years of age, then have this test every 5 years. You may need to have your cholesterol levels checked more often if: Your lipid or cholesterol levels are high. You are older than 62 years of age. You are at high risk for heart disease. What should I know about cancer screening? Many types of cancers can be detected early and may often be prevented. Depending on your health history and family history, you may need to have cancer screening at various ages. This may include screening for: Colorectal cancer. Prostate cancer. Skin cancer. Lung cancer. What should I know about heart disease, diabetes, and high blood pressure? Blood pressure and heart disease High blood pressure causes heart disease and increases the risk of stroke. This is more likely to  develop in people who have high blood pressure readings or are overweight. Talk with your health care provider about your target blood pressure readings. Have your blood pressure checked: Every 3-5 years if you are 30-20 years of age. Every year if you are 64 years old or older. If you are between the ages of 86 and 95 and are a current or former smoker, ask your health care provider if you should have a one-time screening for abdominal aortic aneurysm (AAA). Diabetes Have regular diabetes screenings. This checks your fasting blood sugar level. Have the screening done: Once every three years after age 61 if you are at a normal weight and have a low risk for diabetes. More often and at a younger age if you are overweight or have a high risk for diabetes. What should I know about preventing infection? Hepatitis B If you have a higher risk for hepatitis B, you should be screened for this virus. Talk with your health care provider to find out if you are at risk for hepatitis B infection. Hepatitis C Blood testing is recommended for: Everyone born from [birth year] through 1964. Anyone with known risk factors for hepatitis C. Sexually transmitted infections (STIs) You should be screened each year for STIs, including gonorrhea and chlamydia, if: You are sexually active and are younger than 62 years of age. You are older than 62 years of age and your health care provider tells you  that you are at risk for this type of infection. Your sexual activity has changed since you were last screened, and you are at increased risk for chlamydia or gonorrhea. Ask your health care provider if you are at risk. Ask your health care provider about whether you are at high risk for HIV. Your health care provider may recommend a prescription medicine to help prevent HIV infection. If you choose to take medicine to prevent HIV, you should first get tested for HIV. You should then be tested every 3 months for as long as you are  taking the medicine. Follow these instructions at home: Alcohol use Do not drink alcohol if your health care provider tells you not to drink. If you drink alcohol: Limit how much you have to 0-2 drinks a day. Know how much alcohol is in your drink. In the [place] one drink equals one 12 oz bottle of beer (355 mL), one 5 oz glass of wine (148 mL), or one 1 oz glass of hard liquor (44 mL). Lifestyle Do not use any products that contain nicotine or tobacco. These products include cigarettes, chewing tobacco, and vaping devices, such as e-cigarettes. If you need help quitting, ask your health care provider. Do not use street drugs. Do not share needles. Ask your health care provider for help if you need support or information about quitting drugs. General instructions Schedule regular health, dental, and eye exams. Stay current with your vaccines. Tell your health care provider if: You often feel depressed. You have ever been abused or do not feel safe at home. Summary Adopting a healthy lifestyle and getting preventive care are important in promoting health and wellness. Follow your health care provider's instructions about healthy diet, exercising, and getting tested or screened for diseases. Follow your health care provider's instructions on monitoring your cholesterol and blood pressure. This information is not intended to replace advice given to you by your health care provider. Make sure you discuss any questions you have with your health care provider. Document Revised: 11/13/2020 Document Reviewed: 11/13/2020 Elsevier Patient Education  2024 Elsevier Inc.      Emil Schaumann, MD [facility]

## 2024-02-13 ENCOUNTER — Ambulatory Visit: Payer: Self-pay | Admitting: Emergency Medicine

## 2024-02-13 LAB — COMPREHENSIVE METABOLIC PANEL WITH GFR
ALT: 68 U/L — ABNORMAL HIGH (ref 0–53)
AST: 39 U/L — ABNORMAL HIGH (ref 0–37)
Albumin: 4.3 g/dL (ref 3.5–5.2)
Alkaline Phosphatase: 57 U/L (ref 39–117)
BUN: 12 mg/dL (ref 6–23)
CO2: 29 meq/L (ref 19–32)
Calcium: 9.6 mg/dL (ref 8.4–10.5)
Chloride: 97 meq/L (ref 96–112)
Creatinine, Ser: 0.86 mg/dL (ref 0.40–1.50)
GFR: 92.85 mL/min (ref >60.00)
Glucose, Bld: 120 mg/dL — ABNORMAL HIGH (ref 70–99)
Potassium: 4.3 meq/L (ref 3.5–5.1)
Sodium: 139 meq/L (ref 135–145)
Total Bilirubin: 0.7 mg/dL (ref 0.2–1.2)
Total Protein: 6.7 g/dL (ref 6.0–8.3)

## 2024-02-13 LAB — LIPID PANEL
Cholesterol: 163 mg/dL (ref 0–200)
HDL: 50.1 mg/dL (ref >39.00)
LDL Cholesterol: 78 mg/dL (ref 0–99)
NonHDL: 112.59
Total CHOL/HDL Ratio: 3
Triglycerides: 175 mg/dL — ABNORMAL HIGH (ref 0.0–149.0)
VLDL: 35 mg/dL (ref 0.0–40.0)

## 2024-03-02 ENCOUNTER — Ambulatory Visit
Admission: RE | Admit: 2024-03-02 | Discharge: 2024-03-02 | Disposition: A | Discharge status: Home / Self Care | Source: Ambulatory Visit | Attending: Emergency Medicine | Admitting: Emergency Medicine

## 2024-03-02 DIAGNOSIS — Z122 Encounter for screening for malignant neoplasm of respiratory organs: Secondary | ICD-10-CM

## 2024-03-04 ENCOUNTER — Other Ambulatory Visit: Payer: Self-pay | Admitting: Radiology

## 2024-03-04 ENCOUNTER — Encounter: Payer: Self-pay | Admitting: Emergency Medicine

## 2024-03-04 DIAGNOSIS — I152 Hypertension secondary to endocrine disorders: Secondary | ICD-10-CM

## 2024-03-04 MED ORDER — TIRZEPATIDE 12.5 MG/0.5ML ~~LOC~~ SOAJ: 12.5000 mg | SUBCUTANEOUS | 1 refills | Status: DC

## 2024-04-04 NOTE — Progress Notes (Signed)
 Cardiology Office Note   Date:  04/14/2024   ID:  Jose Bennett, DOB 1961/01/30, MRN [id number]  PCP:  [name], MD  Cardiologist:   Jaleesa Cervi [place], MD   Chief Complaint  Patient presents with   Coronary Artery Disease   Hypertension      History of Present Illness: Jose Bennett is a 63 y.o. male who is seen  for follow up of CAD and LE edema. He has a history of HLD, obesity,  and HTN. CT of chest for lung screening in March 2021 showed 3 vessel coronary calcification. He is a former smoker.  He has a history of DM, OSA and obesity.  In addition to calcification his CT showed emphysema and fatty liver.   Myoview  and Echo in 2022 were unremarkable.    Noted myalgias on Crestor . Was referred to lipid clinic and started Repatha  in Feb. Repeat lipids showed decrease in LDL from 177 to 78. He had swelling on amlodipine  and was switched to valsartan  for BP. Also on HCT daily. He was started on Mounjaro  by PCP. He is frustrated in that he hasn't lost any weight yet. Has not used CPAP- states he just can't wear mask. Notes swelling resolved off amlodipine . Has not been exercising much lately. Was walking 1.5 miles/day.    Past Medical History:  Diagnosis Date   Hyperlipidemia     Past Surgical History:  Procedure Laterality Date   BRAIN SURGERY     KNEE ARTHROSCOPY     x2     Current Outpatient Medications  Medication Sig Dispense Refill   Ascorbic Acid (VITAMIN C) 1000 MG tablet Take 1,000 mg by mouth daily.     aspirin EC 81 MG tablet Take 81 mg by mouth daily.     CALCIUM  PO Take by mouth daily.     Cholecalciferol (VITAMIN D PO) Take by mouth once a week.     Evolocumab  (REPATHA  SURECLICK) 140 MG/ML SOAJ Inject 140 mg into the skin every 14 (fourteen) days. 6 mL 3   hydrochlorothiazide  (HYDRODIURIL ) 25 MG tablet Take 1 tablet (25 mg total) by mouth daily. 90 tablet 3   Magnesium 250 MG TABS Take by mouth daily.     tirzepatide  (MOUNJARO ) 10  MG/0.5ML Pen Inject 10 mg into the skin once a week. 6 mL 0   tirzepatide  (MOUNJARO ) 12.5 MG/0.5ML Pen Inject 12.5 mg into the skin once a week. 2 mL 1   valsartan  (DIOVAN ) 320 MG tablet Take 1 tablet (320 mg total) by mouth daily. 90 tablet 3   Budeson-Glycopyrrol-Formoterol (BREZTRI  AEROSPHERE) 160-9-4.8 MCG/ACT AERO Inhale 2 puffs into the lungs 2 (two) times daily. (Patient not taking: Reported on 04/14/2024) 10.7 g 11   No current facility-administered medications for this visit.    Allergies:   Patient has no known allergies.    Social History:  The patient  reports that he quit smoking about 16 years ago. His smoking use included cigarettes. He started smoking about 50 years ago. He has a 68 pack-year smoking history. He has never used smokeless tobacco. He reports current alcohol use of about 7.0 standard drinks of alcohol per week. He reports that he does not use drugs.   Family History:  The patient's family history includes CAD in his mother; Cancer in his father; Diabetes in his mother; Heart attack (age of onset: 37) in his father and paternal grandfather; Heart attack (age of onset: 54) in his maternal grandfather; Prostate cancer  in his father; Thyroid disease in his sister.    ROS:  Please see the history of present illness.   Otherwise, review of systems are positive for none.   All other systems are reviewed and negative.    PHYSICAL EXAM: VS:  BP (!) 150/82 (BP Location: Left Arm, Patient Position: Sitting, Cuff Size: Large)   Pulse 70   Ht 6' 3 (1.905 m)   Wt (!) 333 lb (151 kg)   SpO2 94%   BMI 41.62 kg/m  , BMI Body mass index is 41.62 kg/m. GEN: Well nourished, morbidly obese, in no acute distress  HEENT: normal  Neck: no JVD, carotid bruits, or masses Cardiac: RRR; no murmurs, rubs, or gallops, no  pitting edema  Respiratory:  clear to auscultation bilaterally, normal work of breathing GI: soft, nontender, nondistended, + BS MS: no deformity or atrophy   Skin: warm and dry, no rash Neuro:  Strength and sensation are intact Psych: euthymic mood, full affect   EKG:  EKG is not ordered today.   Recent Labs: 02/12/2024: ALT 68; BUN 12; Creatinine, Ser 0.86; Hemoglobin 17.5; Platelets 285.0; Potassium 4.3; Sodium 139    Lipid Panel    Component Value Date/Time   CHOL 163 02/12/2024 1353   CHOL 153 08/12/2023 0841   TRIG 175.0 (H) 02/12/2024 1353   HDL 50.10 02/12/2024 1353   HDL 44 08/12/2023 0841   CHOLHDL 3 02/12/2024 1353   VLDL 35.0 02/12/2024 1353   LDLCALC 78 02/12/2024 1353   LDLCALC 80 08/12/2023 0841   LDLDIRECT 188.0 10/02/2009 0836      Wt Readings from Last 3 Encounters:  04/14/24 (!) 333 lb (151 kg)  02/12/24 (!) 332 lb (150.6 kg)  07/18/23 (!) 330 lb 3.2 oz (149.8 kg)      Other studies Reviewed: Additional studies/ records that were reviewed today include:   Myoview  11/24/20: Study Highlights    The left ventricular ejection fraction is mildly decreased (45-54%). Nuclear stress EF: 50%. No T wave inversion was noted during stress. There was no ST segment deviation noted during stress. This is a low risk study.   No reversible ischemia. TID ratio is not elevated. LVEF 50% with normal wall motion. This is a low risk study. No prior for comparison.   Echo 12/18/20: IMPRESSIONS     1. Left ventricular ejection fraction, by estimation, is 65 to 70%. The  left ventricle has normal function. The left ventricle has no regional  wall motion abnormalities. Left ventricular diastolic parameters were  normal.   2. Right ventricular systolic function is normal. The right ventricular  size is normal. There is normal pulmonary artery systolic pressure.   3. The mitral valve is normal in structure. No evidence of mitral valve  regurgitation.   4. The aortic valve is normal in structure. Aortic valve regurgitation is  not visualized.    ASSESSMENT AND PLAN:  1.  CAD with Coronary calcification noted on CT.   Stress Myoview  showed no perfusion abnormality and EF 50%. Echo is normal. Based on these results we need to focus on risk factor modification. He is still asymptomatic.  2. Aortic atherosclerosis 3. Obesity. Continue Carb modified diet. Mounjaro .  4. Hyperlipidemia. Goal LDL < 70. Intolerant of statin. Now on Repatha . LDL did drop 100 points. Will continue 5. HTN- not at ideal level. Swelling on amlodipine . Will increase valsartan  to 320 mg daily. Continue HCT.  6. DM type 2. Per PCP 7. Remote history of tobacco abuse.  Some emphysema noted on CT 8. OSA intolerant of CPAP     Disposition:   FU 1 year   Signed, Layney Gillson [place], MD  04/14/2024 9:17 AM    [facility] [address], [place], [place], [address] Phone [phone], Fax [phone]

## 2024-04-14 ENCOUNTER — Ambulatory Visit: Attending: Cardiology | Admitting: Cardiology

## 2024-04-14 VITALS — BP 150/82 | HR 70 | Ht 75.0 in | Wt 333.0 lb

## 2024-04-14 DIAGNOSIS — I25118 Atherosclerotic heart disease of native coronary artery with other forms of angina pectoris: Secondary | ICD-10-CM

## 2024-04-14 DIAGNOSIS — I10 Essential (primary) hypertension: Secondary | ICD-10-CM | POA: Diagnosis not present

## 2024-04-14 DIAGNOSIS — E785 Hyperlipidemia, unspecified: Secondary | ICD-10-CM

## 2024-04-14 MED ORDER — VALSARTAN 320 MG PO TABS: 320.0000 mg | ORAL_TABLET | Freq: Every day | ORAL | 3 refills | Status: AC

## 2024-04-14 NOTE — Patient Instructions (Signed)
 Medication Instructions:  Increase Valsartan  to 320 mg daily Continue all other medications *If you need a refill on your cardiac medications before your next appointment, please call your pharmacy*  Lab Work: None ordered  Testing/Procedures: None ordered  Follow-Up: At [facility], you and your health needs are our priority.  As part of our continuing mission to provide you with exceptional heart care, our providers are all part of one team.  This team includes your primary Cardiologist (physician) and Advanced Practice Providers or APPs (Physician Assistants and Nurse Practitioners) who all work together to provide you with the care you need, when you need it.  Your next appointment:  1 year   Call in May to schedule Oct appointment     Provider:  Dr.[name]   We recommend signing up for the patient portal called MyChart.  Sign up information is provided on this After Visit Summary.  MyChart is used to connect with patients for Virtual Visits (Telemedicine).  Patients are able to view lab/test results, encounter notes, upcoming appointments, etc.  Non-urgent messages can be sent to your provider as well.   To learn more about what you can do with MyChart, go to [email or web address].

## 2024-04-21 ENCOUNTER — Other Ambulatory Visit: Payer: Self-pay | Admitting: Emergency Medicine

## 2024-06-11 ENCOUNTER — Other Ambulatory Visit: Payer: Self-pay | Admitting: Emergency Medicine

## 2024-07-27 ENCOUNTER — Encounter: Payer: Self-pay | Admitting: Emergency Medicine
